# Patient Record
Sex: Female | Born: 1955 | Race: White | Hispanic: No | Marital: Married | Smoking: Former smoker
Health system: Southern US, Community
[De-identification: ages and names within clinical notes are randomized; demographics above are authoritative.]

## PROBLEM LIST (undated history)

## (undated) DIAGNOSIS — Z1371 Encounter for nonprocreative screening for genetic disease carrier status: Secondary | ICD-10-CM

## (undated) DIAGNOSIS — M810 Age-related osteoporosis without current pathological fracture: Secondary | ICD-10-CM

## (undated) DIAGNOSIS — D649 Anemia, unspecified: Secondary | ICD-10-CM

## (undated) DIAGNOSIS — K635 Polyp of colon: Secondary | ICD-10-CM

## (undated) DIAGNOSIS — T4145XA Adverse effect of unspecified anesthetic, initial encounter: Secondary | ICD-10-CM

## (undated) DIAGNOSIS — T8859XA Other complications of anesthesia, initial encounter: Secondary | ICD-10-CM

## (undated) HISTORY — PX: CHOLECYSTECTOMY: SHX55

## (undated) HISTORY — PX: APPENDECTOMY: SHX54

## (undated) HISTORY — PX: LAPAROSCOPIC OOPHERECTOMY: SHX6507

## (undated) HISTORY — PX: ABDOMINAL HYSTERECTOMY: SHX81

## (undated) HISTORY — DX: Encounter for nonprocreative screening for genetic disease carrier status: Z13.71

## (undated) HISTORY — PX: DIAGNOSTIC LAPAROSCOPY: SUR761

---

## 1999-01-13 ENCOUNTER — Other Ambulatory Visit: Admission: RE | Admit: 1999-01-13 | Discharge: 1999-01-13 | Payer: Self-pay | Admitting: Gynecology

## 2001-08-16 ENCOUNTER — Ambulatory Visit (HOSPITAL_COMMUNITY): Admission: RE | Admit: 2001-08-16 | Discharge: 2001-08-16 | Payer: Self-pay | Admitting: Gynecology

## 2001-08-16 ENCOUNTER — Encounter: Payer: Self-pay | Admitting: Gynecology

## 2002-01-26 ENCOUNTER — Ambulatory Visit (HOSPITAL_COMMUNITY): Admission: RE | Admit: 2002-01-26 | Discharge: 2002-01-26 | Payer: Self-pay | Admitting: Gynecology

## 2002-10-12 ENCOUNTER — Other Ambulatory Visit: Admission: RE | Admit: 2002-10-12 | Discharge: 2002-10-12 | Payer: Self-pay | Admitting: Gynecology

## 2004-03-17 ENCOUNTER — Other Ambulatory Visit: Admission: RE | Admit: 2004-03-17 | Discharge: 2004-03-17 | Payer: Self-pay | Admitting: Gynecology

## 2004-12-09 ENCOUNTER — Ambulatory Visit: Payer: Self-pay | Admitting: Neurosurgery

## 2005-04-20 ENCOUNTER — Other Ambulatory Visit: Admission: RE | Admit: 2005-04-20 | Discharge: 2005-04-20 | Payer: Self-pay | Admitting: Gynecology

## 2005-04-23 ENCOUNTER — Observation Stay (HOSPITAL_COMMUNITY): Admission: RE | Admit: 2005-04-23 | Discharge: 2005-04-24 | Payer: Self-pay | Admitting: Gynecology

## 2005-04-23 ENCOUNTER — Ambulatory Visit (HOSPITAL_BASED_OUTPATIENT_CLINIC_OR_DEPARTMENT_OTHER): Admission: RE | Admit: 2005-04-23 | Discharge: 2005-04-23 | Payer: Self-pay | Admitting: Gynecology

## 2005-04-23 ENCOUNTER — Ambulatory Visit (HOSPITAL_COMMUNITY): Admission: RE | Admit: 2005-04-23 | Discharge: 2005-04-23 | Payer: Self-pay | Admitting: Gynecology

## 2006-04-28 ENCOUNTER — Other Ambulatory Visit: Admission: RE | Admit: 2006-04-28 | Discharge: 2006-04-28 | Payer: Self-pay | Admitting: Gynecology

## 2008-10-16 ENCOUNTER — Ambulatory Visit: Payer: Self-pay | Admitting: Otolaryngology

## 2013-10-31 ENCOUNTER — Ambulatory Visit: Payer: Self-pay | Admitting: Podiatry

## 2013-12-18 ENCOUNTER — Other Ambulatory Visit: Payer: Self-pay

## 2013-12-18 DIAGNOSIS — Z1231 Encounter for screening mammogram for malignant neoplasm of breast: Secondary | ICD-10-CM

## 2014-01-07 ENCOUNTER — Ambulatory Visit
Admission: RE | Admit: 2014-01-07 | Discharge: 2014-01-07 | Disposition: A | Discharge status: Home / Self Care | Payer: 59 | Source: Ambulatory Visit

## 2014-01-07 DIAGNOSIS — Z1231 Encounter for screening mammogram for malignant neoplasm of breast: Secondary | ICD-10-CM

## 2014-01-08 ENCOUNTER — Ambulatory Visit: Payer: Self-pay

## 2015-02-13 ENCOUNTER — Encounter: Payer: Self-pay | Admitting: Podiatry

## 2015-02-13 ENCOUNTER — Ambulatory Visit (INDEPENDENT_AMBULATORY_CARE_PROVIDER_SITE_OTHER): Payer: 59 | Admitting: Podiatry

## 2015-02-13 VITALS — BP 179/98 | HR 78 | Resp 16 | Ht 60.0 in | Wt 170.0 lb

## 2015-02-13 DIAGNOSIS — L603 Nail dystrophy: Secondary | ICD-10-CM

## 2015-02-13 DIAGNOSIS — L608 Other nail disorders: Secondary | ICD-10-CM

## 2015-02-13 DIAGNOSIS — B351 Tinea unguium: Secondary | ICD-10-CM

## 2015-02-13 NOTE — Progress Notes (Signed)
   Subjective:    Patient ID: Diana Rodriguez, female    DOB: May 15, 1956, 59 y.o.   MRN: [id number]  HPI  59 year old female presents the office today with complaints of bilateral big toe nail discoloration and separation of the nail from the underlying skin. She states that this been ongoing for approximate 6 months. She believes that this area started after getting a pedicure that she is unsure. She denies any drainage or redness on the nails. She does state of the right inside border the big toenail does hurt occasionally however it is currently asymptomatic. No other complaints at this time.  Review of Systems  All other systems reviewed and are negative.      Objective:   Physical Exam AAO x3, NAD DP/PT pulses palpable bilaterally, CRT less than 3 seconds Protective sensation intact with Simms Weinstein monofilament, vibratory sensation intact, Achilles tendon reflex intact Bilateral hallux nails are loose from the underlying nailbed distally however it is firmly adhered proximally. There is no sensory thickened discoloration to the left hallux nail. There is some discoloration to the right hallux nail particularly in the medial nail border and which it is slightly more thickened and has some dark discoloration. There is mild incurvation of the nail. There is mild tenderness upon the distal medial aspect of the nail border have there is no pain on the proximal medial nail border of the right hallux. There is no surrounding erythema bilaterally or any ascending cellulitis, drainage/purulence, malodor, or other clinical signs of infection. The remaining nails appear to be without pathology. No other areas of tenderness to bilateral lower extremities. MMT 5/5, ROM WNL.  No open lesions or pre-ulcerative lesions.  No overlying edema, erythema, increase in warmth to bilateral lower extremities.  No pain with calf compression, swelling, warmth, erythema bilaterally.      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 -Treatment options were discussed the patient include alternatives, risks, complications. -Distal medial portion of the right hallux nail sharply debrided without complications to patient comfort. Discussed the area remains symptomatically in the future may need a partial nail avulsion with chemical matricectomy. -At this time the nails were biopsied and sent to [facility] for evaluation. I discussed possible treatments for onychomycosis however will await culture results before initiating treatment. If the culture results are negative discussed N[place]ail.  -Follow-up after culture results are obtained or sooner if any palms are to arise. In the meantime occurs call the office questions, concerns, changes symptoms.

## 2015-02-14 ENCOUNTER — Encounter: Payer: Self-pay | Admitting: Podiatry

## 2015-02-27 ENCOUNTER — Encounter: Payer: Self-pay | Admitting: Podiatry

## 2015-03-11 ENCOUNTER — Other Ambulatory Visit: Payer: Self-pay

## 2015-03-11 ENCOUNTER — Ambulatory Visit (INDEPENDENT_AMBULATORY_CARE_PROVIDER_SITE_OTHER): Payer: 59 | Admitting: Podiatry

## 2015-03-11 VITALS — BP 118/78 | HR 61 | Resp 16

## 2015-03-11 DIAGNOSIS — B351 Tinea unguium: Secondary | ICD-10-CM | POA: Diagnosis not present

## 2015-03-11 DIAGNOSIS — Z1231 Encounter for screening mammogram for malignant neoplasm of breast: Secondary | ICD-10-CM

## 2015-03-11 MED ORDER — TAVABOROLE 5 % EX SOLN: 1.0000 [drp] | CUTANEOUS | Status: DC

## 2015-03-13 NOTE — Progress Notes (Signed)
Patient ID: Diana Rodriguez, female   DOB: 1956-04-22, 59 y.o.   MRN: [id number]  Subjective: 59 year old female presents the office today to discuss nail biopsy results. She denies any acute changes since last appointment there is no new complaints at this time. She denies any redness or drainage on the nail sites and denies any pain with him at this time.  Objective: AAO 3, NAD Neurovascular status unchanged. Bilateral hallux nails are firmly adhered to the underlying nail bed. There is continuation of his coloration, dystrophy, hypertrophy of the left hallux toenail. There is continuing discoloration the right hallux nail and mild hypertrophy/discoloration the nail particularly within the nail border. There is no tenderness palpation along the toenails there is no surrounding erythema or drainage. No areas of tenderness to bilateral lower extremity's. No open edema, erythema, increase in warmth. No open lesions or pre-ulcerative lesions. No pain with calf compression, swelling, warmth, erythema.  Assessment: 59 year old female with onychomycosis  Plan: -Nail biopsy results were discussed the patient which revealed onychomycosis (Saprophytic fungi). I discussed treatment options for this particular fungus. At this time she has elected to proceed with kerydin, knowing that it may not be the best option for this fungus. Perception was sent to [facility]. Discussed how to apply the medication as well as side effects. Follow-up as needed. In the meantime encouraged to call the office with any questions or concerns.

## 2015-03-24 ENCOUNTER — Ambulatory Visit
Admission: RE | Admit: 2015-03-24 | Discharge: 2015-03-24 | Disposition: A | Discharge status: Home / Self Care | Payer: 59 | Source: Ambulatory Visit

## 2015-03-24 DIAGNOSIS — Z1231 Encounter for screening mammogram for malignant neoplasm of breast: Secondary | ICD-10-CM

## 2015-06-30 ENCOUNTER — Encounter: Admission: RE | Payer: Self-pay | Source: Ambulatory Visit

## 2015-06-30 ENCOUNTER — Ambulatory Visit: Admission: RE | Admit: 2015-06-30 | Payer: 59 | Source: Ambulatory Visit | Admitting: Gastroenterology

## 2015-06-30 SURGERY — COLONOSCOPY WITH PROPOFOL
Anesthesia: General

## 2015-09-10 ENCOUNTER — Telehealth: Payer: Self-pay | Admitting: *Deleted

## 2015-09-10 MED ORDER — TAVABOROLE 5 % EX SOLN: 1.0000 [drp] | CUTANEOUS | Status: DC

## 2015-09-10 NOTE — Telephone Encounter (Signed)
Refill request for Kerydin.  Dr. Berton Lan refills +[phone].

## 2016-01-28 ENCOUNTER — Other Ambulatory Visit: Payer: Self-pay

## 2016-01-28 DIAGNOSIS — Z1231 Encounter for screening mammogram for malignant neoplasm of breast: Secondary | ICD-10-CM

## 2016-02-24 DIAGNOSIS — B9689 Other specified bacterial agents as the cause of diseases classified elsewhere: Secondary | ICD-10-CM | POA: Diagnosis not present

## 2016-02-24 DIAGNOSIS — J208 Acute bronchitis due to other specified organisms: Secondary | ICD-10-CM | POA: Diagnosis not present

## 2016-02-24 MED FILL — predniSONE 5 MG (21) TBPK: 5 | 6 days supply | Qty: 21 | Fill #0

## 2016-02-24 MED FILL — MOXIFLOXACIN HCL 400 MG TAB: 400 | 10 days supply | Qty: 10 | Fill #0

## 2016-03-22 DIAGNOSIS — Z1283 Encounter for screening for malignant neoplasm of skin: Secondary | ICD-10-CM | POA: Diagnosis not present

## 2016-03-22 DIAGNOSIS — L820 Inflamed seborrheic keratosis: Secondary | ICD-10-CM | POA: Diagnosis not present

## 2016-03-22 DIAGNOSIS — D485 Neoplasm of uncertain behavior of skin: Secondary | ICD-10-CM | POA: Diagnosis not present

## 2016-03-24 ENCOUNTER — Ambulatory Visit
Admission: RE | Admit: 2016-03-24 | Discharge: 2016-03-24 | Disposition: A | Discharge status: Home / Self Care | Payer: 59 | Source: Ambulatory Visit

## 2016-03-24 DIAGNOSIS — Z1231 Encounter for screening mammogram for malignant neoplasm of breast: Secondary | ICD-10-CM | POA: Diagnosis not present

## 2016-04-06 DIAGNOSIS — Z0000 Encounter for general adult medical examination without abnormal findings: Secondary | ICD-10-CM | POA: Diagnosis not present

## 2016-04-14 DIAGNOSIS — R7301 Impaired fasting glucose: Secondary | ICD-10-CM | POA: Diagnosis not present

## 2016-04-14 DIAGNOSIS — M810 Age-related osteoporosis without current pathological fracture: Secondary | ICD-10-CM | POA: Diagnosis not present

## 2016-04-14 DIAGNOSIS — M25474 Effusion, right foot: Secondary | ICD-10-CM | POA: Diagnosis not present

## 2016-04-14 DIAGNOSIS — Z0000 Encounter for general adult medical examination without abnormal findings: Secondary | ICD-10-CM | POA: Diagnosis not present

## 2016-05-05 DIAGNOSIS — Z1211 Encounter for screening for malignant neoplasm of colon: Secondary | ICD-10-CM | POA: Diagnosis not present

## 2016-05-05 DIAGNOSIS — M810 Age-related osteoporosis without current pathological fracture: Secondary | ICD-10-CM | POA: Diagnosis not present

## 2016-08-20 ENCOUNTER — Encounter: Payer: Self-pay | Admitting: *Deleted

## 2016-08-23 ENCOUNTER — Ambulatory Visit: Payer: 59 | Admitting: Anesthesiology

## 2016-08-23 ENCOUNTER — Ambulatory Visit
Admission: RE | Admit: 2016-08-23 | Discharge: 2016-08-23 | Disposition: A | Discharge status: Home / Self Care | Payer: 59 | Source: Ambulatory Visit | Attending: Gastroenterology | Admitting: Gastroenterology

## 2016-08-23 ENCOUNTER — Encounter
Admission: RE | Disposition: A | Discharge status: Home / Self Care | Payer: Self-pay | Source: Ambulatory Visit | Attending: Gastroenterology

## 2016-08-23 DIAGNOSIS — Q439 Congenital malformation of intestine, unspecified: Secondary | ICD-10-CM | POA: Diagnosis not present

## 2016-08-23 DIAGNOSIS — D122 Benign neoplasm of ascending colon: Secondary | ICD-10-CM | POA: Diagnosis not present

## 2016-08-23 DIAGNOSIS — Z1211 Encounter for screening for malignant neoplasm of colon: Secondary | ICD-10-CM | POA: Insufficient documentation

## 2016-08-23 DIAGNOSIS — M810 Age-related osteoporosis without current pathological fracture: Secondary | ICD-10-CM | POA: Insufficient documentation

## 2016-08-23 DIAGNOSIS — Z881 Allergy status to other antibiotic agents status: Secondary | ICD-10-CM | POA: Diagnosis not present

## 2016-08-23 DIAGNOSIS — D123 Benign neoplasm of transverse colon: Secondary | ICD-10-CM | POA: Diagnosis not present

## 2016-08-23 DIAGNOSIS — Z880 Allergy status to penicillin: Secondary | ICD-10-CM | POA: Diagnosis not present

## 2016-08-23 DIAGNOSIS — K6389 Other specified diseases of intestine: Secondary | ICD-10-CM | POA: Diagnosis not present

## 2016-08-23 DIAGNOSIS — D120 Benign neoplasm of cecum: Secondary | ICD-10-CM | POA: Insufficient documentation

## 2016-08-23 DIAGNOSIS — K635 Polyp of colon: Secondary | ICD-10-CM | POA: Diagnosis not present

## 2016-08-23 DIAGNOSIS — Z886 Allergy status to analgesic agent status: Secondary | ICD-10-CM | POA: Insufficient documentation

## 2016-08-23 DIAGNOSIS — D649 Anemia, unspecified: Secondary | ICD-10-CM | POA: Insufficient documentation

## 2016-08-23 DIAGNOSIS — Z885 Allergy status to narcotic agent status: Secondary | ICD-10-CM | POA: Insufficient documentation

## 2016-08-23 HISTORY — PX: COLONOSCOPY WITH PROPOFOL: SHX5780

## 2016-08-23 HISTORY — DX: Age-related osteoporosis without current pathological fracture: M81.0

## 2016-08-23 HISTORY — DX: Anemia, unspecified: D64.9

## 2016-08-23 SURGERY — COLONOSCOPY WITH PROPOFOL
Anesthesia: General

## 2016-08-23 MED ORDER — PROPOFOL 500 MG/50ML IV EMUL
INTRAVENOUS | Status: DC | PRN
  Administered 2016-08-23: 150 ug/kg/min via INTRAVENOUS

## 2016-08-23 MED ORDER — SODIUM CHLORIDE 0.9 % IV SOLN: INTRAVENOUS | Status: DC

## 2016-08-23 MED ORDER — FENTANYL CITRATE (PF) 100 MCG/2ML IJ SOLN
INTRAMUSCULAR | Status: DC | PRN
  Administered 2016-08-23: 50 ug via INTRAVENOUS

## 2016-08-23 MED ORDER — SPOT INK MARKER SYRINGE KIT
PACK | SUBMUCOSAL | Status: DC | PRN
  Administered 2016-08-23: 3 mL via SUBMUCOSAL

## 2016-08-23 MED ORDER — MIDAZOLAM HCL 2 MG/2ML IJ SOLN
INTRAMUSCULAR | Status: DC | PRN
  Administered 2016-08-23: 1 mg via INTRAVENOUS

## 2016-08-23 MED ORDER — PROPOFOL 10 MG/ML IV BOLUS
INTRAVENOUS | Status: DC | PRN
  Administered 2016-08-23: 50 mg via INTRAVENOUS

## 2016-08-23 MED ORDER — SODIUM CHLORIDE 0.9 % IV SOLN
INTRAVENOUS | Status: DC
  Administered 2016-08-23: 11:00:00 via INTRAVENOUS
  Administered 2016-08-23: 1000 mL via INTRAVENOUS

## 2016-08-23 NOTE — Anesthesia Postprocedure Evaluation (Signed)
Anesthesia Post Note  Patient: Diana Rodriguez  Procedure(s) Performed: Procedure(s) (LRB): COLONOSCOPY WITH PROPOFOL (N/A)  Patient location during evaluation: Endoscopy Anesthesia Type: General Level of consciousness: awake and alert and oriented Pain management: pain level controlled Vital Signs Assessment: post-procedure vital signs reviewed and stable Respiratory status: spontaneous breathing, nonlabored ventilation and respiratory function stable Cardiovascular status: blood pressure returned to baseline and stable Postop Assessment: no signs of nausea or vomiting Anesthetic complications: no    Last Vitals:  Vitals:   08/23/16 1150 08/23/16 1200  BP: 123/70 125/71  Pulse: (!) 51 62  Resp: 11 13  Temp:      Last Pain:  Vitals:   08/23/16 1130  TempSrc: Tympanic                 Kais Monje

## 2016-08-23 NOTE — Transfer of Care (Signed)
Immediate Anesthesia Transfer of Care Note  Patient: DERRY SLAVEN  Procedure(s) Performed: Procedure(s): COLONOSCOPY WITH PROPOFOL (N/A)  Patient Location: PACU  Anesthesia Type:General  Level of Consciousness: sedated  Airway & Oxygen Therapy: Patient Spontanous Breathing and Patient connected to nasal cannula oxygen  Post-op Assessment: Report given to RN and Post -op Vital signs reviewed and stable  Post vital signs: Reviewed and stable  Last Vitals:  Vitals:   08/23/16 0850 08/23/16 1130  BP: 140/61 (!) 109/55  Pulse: (!) 56 62  Resp: 16 16  Temp: (!) 35.6 C (!) 35.9 C    Last Pain:  Vitals:   08/23/16 1130  TempSrc: Tympanic         Complications: No apparent anesthesia complications

## 2016-08-23 NOTE — Anesthesia Preprocedure Evaluation (Signed)
Anesthesia Evaluation  Patient identified by MRN, date of birth, ID band Patient awake    Reviewed: Allergy & Precautions, NPO status , Patient's Chart, lab work & pertinent test results  History of Anesthesia Complications Negative for: history of anesthetic complications  Airway Mallampati: II  TM Distance: >3 FB Neck ROM: Full    Dental no notable dental hx.    Pulmonary neg sleep apnea, neg COPD, former smoker,    breath sounds clear to auscultation- rhonchi (-) wheezing      Cardiovascular Exercise Tolerance: Good (-) hypertension(-) CAD and (-) Past MI  Rhythm:Regular Rate:Normal - Systolic murmurs and - Diastolic murmurs    Neuro/Psych negative neurological ROS  negative psych ROS   GI/Hepatic Neg liver ROS,   Endo/Other  negative endocrine ROS  Renal/GU negative Renal ROS     Musculoskeletal   Abdominal (+) + obese,   Peds  Hematology negative hematology ROS (+)   Anesthesia Other Findings Past Medical History: No date: Anemia No date: Osteoporosis   Reproductive/Obstetrics                            Anesthesia Physical Anesthesia Plan  ASA: II  Anesthesia Plan: General   Post-op Pain Management:    Induction: Intravenous  Airway Management Planned: Natural Airway  Additional Equipment:   Intra-op Plan:   Post-operative Plan:   Informed Consent: I have reviewed the patients History and Physical, chart, labs and discussed the procedure including the risks, benefits and alternatives for the proposed anesthesia with the patient or authorized representative who has indicated his/her understanding and acceptance.   Dental advisory given  Plan Discussed with: CRNA and Anesthesiologist  Anesthesia Plan Comments:         Anesthesia Quick Evaluation

## 2016-08-23 NOTE — H&P (Signed)
Outpatient short stay form Pre-procedure 08/23/2016 10:37 AM Lollie Sails MD  Primary Physician: Dr. Maryland Pink  Reason for visit: Colonoscopy  History of present illness:  Patient is a 60 year old female presenting today for screening colonoscopy. This is her first colonoscopy. She takes no aspirin or blood thinning agents. Her prep well.     Current Facility-Administered Medications:  .  0.9 %  sodium chloride infusion, , Intravenous, Continuous, Lollie Sails, MD, Last Rate: 20 mL/hr at 08/23/16 0902, 1,000 mL at 08/23/16 0902 .  0.9 %  sodium chloride infusion, , Intravenous, Continuous, Lollie Sails, MD  Prescriptions Prior to Admission  Medication Sig Dispense Refill Last Dose  . Tavaborole (KERYDIN) 5 % SOLN Apply 1 drop topically 1 day or 1 dose. Apply 1 drop to the toenail daily. (Patient not taking: Reported on 08/23/2016) 10 mL 11 Completed Course at Unknown time     Allergies  Allergen Reactions  . Erythromycin Anaphylaxis  . Advil [Ibuprofen] Swelling  . Cephalexin Hives  . Doxycycline Hives  . Penicillins Hives  . Septra [Sulfamethoxazole-Trimethoprim] Hives  . Vicodin [Hydrocodone-Acetaminophen] Hives     Past Medical History:  Diagnosis Date  . Anemia   . Osteoporosis     Review of systems:      Physical Exam    Heart and lungs: Regular rate and rhythm without rub or gallop, lungs are bilaterally clear.    HEENT: Normocephalic atraumatic eyes are anicteric    Other:     Pertinant exam for procedure: Soft nontender nondistended bowel sounds positive normoactive.    Planned proceedures: Colonoscopy and indicated procedures. I have discussed the risks benefits and complications of procedures to include not limited to bleeding, infection, perforation and the risk of sedation and the patient wishes to proceed.    Lollie Sails, MD Gastroenterology 08/23/2016  10:37 AM

## 2016-08-23 NOTE — Op Note (Signed)
[facility] Gastroenterology Patient Name: Diana Rodriguez Procedure Date: 08/23/2016 10:39 AM MRN: [id number] Account #: [id number] Date of Birth: 06-Dec-1955 Admit Type: Outpatient Age: 60 Room: [facility] ENDO ROOM 3 Gender: Female Note Status: Finalized Procedure:            Colonoscopy Indications:          Screening for colorectal malignant neoplasm, This is                        the patient's first colonoscopy Providers:            Lollie Sails, MD Referring MD:         Irven Easterly. Kary Kos, MD (Referring MD) Medicines:            Monitored Anesthesia Care Complications:        No immediate complications. Procedure:            Pre-Anesthesia Assessment:                       - ASA Grade Assessment: II - A patient with mild                        systemic disease.                       After obtaining informed consent, the colonoscope was                        passed under direct vision. Throughout the procedure,                        the patient's blood pressure, pulse, and oxygen                        saturations were monitored continuously. The                        Colonoscope was introduced through the anus and                        advanced to the the cecum, identified by appendiceal                        orifice and ileocecal valve. The colonoscopy was                        performed with moderate difficulty due to a tortuous                        colon. Successful completion of the procedure was aided                        by using manual pressure. The patient tolerated the                        procedure well. The quality of the bowel preparation                        was good. Findings:      Two flat polyps were found in the transverse colon. The polyps were 2  to       3 mm in size. These polyps were removed with a cold biopsy forceps.       Resection and retrieval were complete.      A 2 mm polyp was found in the cecum. The polyp was  sessile. The polyp       was removed with a cold biopsy forceps. Resection and retrieval were       complete.      A greater than 50 mm polyp was found in the proximal ascending colon.       The polyp was carpet-like and sessile. Biopsies were taken with a cold       forceps for histology.      The exam was otherwise without abnormality.      The digital rectal exam was normal. Impression:           - Two 2 to 3 mm polyps in the transverse colon, removed                        with a cold biopsy forceps. Resected and retrieved.                       - One 2 mm polyp in the cecum, removed with a cold                        biopsy forceps. Resected and retrieved.                       - One greater than 50 mm polyp in the proximal                        ascending colon. Biopsied.                       - The examination was otherwise normal. Recommendation:       - Discharge patient to home.                       - Await pathology results. Procedure Code(s):    --- Professional ---                       [phone], Colonoscopy, flexible; with biopsy, single or                        multiple Diagnosis Code(s):    --- Professional ---                       Z12.11, Encounter for screening for malignant neoplasm                        of colon                       D12.3, Benign neoplasm of transverse colon (hepatic                        flexure or splenic flexure)                       D12.0, Benign neoplasm of cecum  D12.2, Benign neoplasm of ascending colon CPT copyright 2016 American Medical Association. All rights reserved. The codes documented in this report are preliminary and upon coder review may  be revised to meet current compliance requirements. Lollie Sails, MD 08/23/2016 11:33:56 AM This report has been signed electronically. Number of Addenda: 0 Note Initiated On: 08/23/2016 10:39 AM Scope Withdrawal Time: 0 hours 18 minutes 42 seconds  Total Procedure  Duration: 0 hours 38 minutes 32 seconds       [facility]

## 2016-08-24 ENCOUNTER — Encounter: Payer: Self-pay | Admitting: Gastroenterology

## 2016-08-24 ENCOUNTER — Encounter: Payer: Self-pay | Admitting: *Deleted

## 2016-08-24 LAB — SURGICAL PATHOLOGY

## 2016-09-06 ENCOUNTER — Ambulatory Visit (INDEPENDENT_AMBULATORY_CARE_PROVIDER_SITE_OTHER): Payer: 59 | Admitting: General Surgery

## 2016-09-06 ENCOUNTER — Encounter: Payer: Self-pay | Admitting: General Surgery

## 2016-09-06 VITALS — BP 126/72 | HR 76 | Resp 12 | Ht 60.0 in | Wt 172.0 lb

## 2016-09-06 DIAGNOSIS — D122 Benign neoplasm of ascending colon: Secondary | ICD-10-CM | POA: Diagnosis not present

## 2016-09-06 NOTE — Progress Notes (Signed)
Patient ID: Diana Rodriguez, female   DOB: 1956/09/26, 60 y.o.   MRN: [id number]  Chief Complaint  Patient presents with  . Other    discuss colon polyp    HPI Diana Rodriguez is a 60 y.o. female here today to discuss a colon polyp removal. She had a colonoscopy done on 08/23/16.Denies GI Issues, moves bowels daily. No family history of colon cancer.    I have reviewed the history of present illness with the patient.  HPI  Past Medical History:  Diagnosis Date  . Anemia   . Osteoporosis     Past Surgical History:  Procedure Laterality Date  . ABDOMINAL HYSTERECTOMY    . APPENDECTOMY    . CHOLECYSTECTOMY    . COLONOSCOPY WITH PROPOFOL N/A 08/23/2016   Procedure: COLONOSCOPY WITH PROPOFOL;  Surgeon: Lollie Sails, MD;  Location: [facility] ENDOSCOPY;  Service: Endoscopy;  Laterality: N/A;  . DIAGNOSTIC LAPAROSCOPY    . LAPAROSCOPIC OOPHERECTOMY      Family History  Problem Relation Age of Onset  . Cancer Mother     lung  . Cancer Father     lung  . Cancer Paternal Aunt     breast cancer    Social History Social History  Substance Use Topics  . Smoking status: Former Smoker    Quit date: 11/29/1990  . Smokeless tobacco: Former Systems developer  . Alcohol use 0.0 oz/week    Allergies  Allergen Reactions  . Erythromycin Anaphylaxis  . Advil [Ibuprofen] Swelling  . Cephalexin Hives  . Doxycycline Hives  . Penicillins Hives  . Septra [Sulfamethoxazole-Trimethoprim] Hives  . Vicodin [Hydrocodone-Acetaminophen] Hives    No current outpatient prescriptions on file.   No current facility-administered medications for this visit.     Review of Systems Review of Systems  Constitutional: Negative.   Respiratory: Negative.   Cardiovascular: Negative.   Gastrointestinal: Negative.     Blood pressure 126/72, pulse 76, resp. rate 12, height 5' (1.524 m), weight 172 lb (78 kg).  Physical Exam Physical Exam  Constitutional: She is oriented to person, place, and time. She appears  well-developed and well-nourished.  Eyes: Conjunctivae are normal. No scleral icterus.  Neck: Neck supple.  Cardiovascular: Normal rate, regular rhythm and normal heart sounds.   Pulmonary/Chest: Effort normal and breath sounds normal.  Abdominal: Soft. Bowel sounds are normal. There is no tenderness. A hernia (small umbilical hernia present) is present.  Lymphadenopathy:    She has no cervical adenopathy.  Neurological: She is alert and oriented to person, place, and time.  Skin: Skin is warm and dry.    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 Rationale , risks and benefits explained Schedule  Laparoscopy with  resection of  right colon. Risks and benefits of the procedure were discussed with the patient.   Approximate 6 weeks for return to full activity. May return to her current work in 2-3 weeks   This information has been scribed by Verlene Mayer, [place] 09/06/2016, 4:10 PM

## 2016-09-06 NOTE — Patient Instructions (Addendum)
Laparoscopic Colectomy  Laparoscopic colectomy is surgery to remove part or all of the large intestine (colon). This procedure is used to treat several conditions, including:  · Inflammation and infection of the colon (diverticulitis).  · Tumors or masses in the colon.  · Inflammatory bowel disease, such as Crohn disease or ulcerative colitis. Colectomy is an option when symptoms cannot be controlled with medicines.  · Bleeding from the colon that cannot be controlled by another method.  · Blockage or obstruction of the colon.  LET YOUR HEALTH CARE PROVIDER KNOW ABOUT:  · Any allergies you have.  · All medicines you are taking, including vitamins, herbs, eye drops, creams, and over-the-counter medicines.  · Previous problems you or members of your family have had with the use of anesthetics.  · Any blood disorders you have.  · Previous surgeries you have had.  · Medical conditions you have.  RISKS AND COMPLICATIONS  Generally, this is a safe procedure. However, as with any procedure, complications can occur. Possible complications include:  · Infection.  · Bleeding.  · Damage to other organs.  · Leaking from where the colon was sewn together.  · Future blockage of the small intestines from scar tissue. Another surgery may be needed to repair this.  In some cases, complications such as damage to other organs or excessive bleeding may require the surgeon to convert from a laparoscopic procedure to an open procedure. This involves making a larger incision in the abdomen to perform the procedure.  BEFORE THE PROCEDURE  · Ask your health care provider about changing or stopping any regular medicines.  · You may be prescribed an oral bowel prep. This involves drinking a large amount of medicated liquid, starting the day before your surgery. The liquid will cause you to have multiple loose stools until your stool is almost clear or light green. This cleans out your colon in preparation for the surgery.  · Do not eat or  drink anything else once you have started the bowel prep, unless your health care provider tells you it is safe to do so.  · You may also be given antibiotic pills to clean out your colon of bacteria. Be sure to follow the directions carefully and take the medicine at the correct time.  PROCEDURE   · Small monitors will be put on your body. They are used to check your heart, blood pressure, and oxygen level.  · An IV access tube will be put into one of your veins. Medicine will be able to flow directly into your body through this IV tube.  · You might be given a medicine to help you relax (sedative).  · You will be given a medicine to make you sleep through the procedure (general anesthetic). A breathing tube may be placed into your lungs during the procedure.  · A thin, flexible tube (catheter) will be placed into your bladder to collect urine.  · A tube may be put in through your nose. It is called a nasogastric tube. It is used to remove stomach fluids after surgery until the intestines start working again.  · Your abdomen will be filled with air so that it expands. This gives the surgeon more room to operate and makes your organs easier to see.  · Several small cuts (incisions) are made in your abdomen.  · A thin, lighted tube with a tiny camera on the end (laparoscope) is put through one of the small incisions. The camera on the laparoscope   sends a picture to a TV screen in the operating room. This gives the surgeon a good view inside your abdomen.  · Hollow tubes are put through the other small incisions in your abdomen. The tools needed for the procedure are put through these tubes.  · Clamps or staples are put on both ends of the diseased part of the colon.  · The part of the intestine between the clamps or staples is removed.  · If possible, the ends of the healthy colon that remain will be stitched or stapled together to allow your body to expel waste (stool).  · Sometimes, the remaining colon cannot be  stitched back together. If this is the case, a colostomy is needed. For a colostomy:  ¨ An opening (stoma) to the outside of your body is made through the abdomen.  ¨ The end of the colon is brought to the opening. It is stitched to the skin.  ¨ A bag is attached to the opening. Stool will drain into this bag. The bag is removable.  ¨ The colostomy can be temporary or permanent.  · The incisions from the colectomy are closed with stitches or staples.  AFTER THE PROCEDURE  · You will be monitored closely in a recovery area until you are stable and doing well. You will then be moved to a regular hospital room.  · You will need to receive fluids through an IV tube until your bowel function has returned. This may take 1-3 days. Once your bowels are working again, you will be started on clear liquids and then advanced to solid food as tolerated.  · You will be given pain medicines to control your pain.     This information is not intended to replace advice given to you by your health care provider. Make sure you discuss any questions you have with your health care provider.     Document Released: 02/05/2003 Document Revised: 09/05/2013 Document Reviewed: 06/27/2013  Elsevier Interactive Patient Education ©2016 [employer]

## 2016-10-07 ENCOUNTER — Encounter: Payer: Self-pay | Admitting: *Deleted

## 2016-10-07 ENCOUNTER — Telehealth: Payer: Self-pay | Admitting: *Deleted

## 2016-10-07 NOTE — Telephone Encounter (Signed)
Patient's surgery has been scheduled for 12-24-16 at [facility].

## 2016-11-18 ENCOUNTER — Ambulatory Visit: Payer: 59 | Admitting: General Surgery

## 2016-12-01 ENCOUNTER — Encounter: Payer: Self-pay | Admitting: *Deleted

## 2016-12-01 NOTE — Progress Notes (Signed)
Patient's surgery has been rescheduled from 12-24-16 to 12-20-16 due to a change in Dr. Angie Fava schedule. This patient is agreeable.   Leah in the O.R. Notified of date change.

## 2016-12-13 ENCOUNTER — Encounter: Payer: Self-pay | Admitting: General Surgery

## 2016-12-13 ENCOUNTER — Ambulatory Visit (INDEPENDENT_AMBULATORY_CARE_PROVIDER_SITE_OTHER): Payer: 59 | Admitting: General Surgery

## 2016-12-13 VITALS — BP 130/82 | HR 74 | Resp 14 | Ht 60.0 in | Wt 172.0 lb

## 2016-12-13 DIAGNOSIS — D122 Benign neoplasm of ascending colon: Secondary | ICD-10-CM

## 2016-12-13 MED ORDER — POLYETHYLENE GLYCOL 3350 17 GM/SCOOP PO POWD: ORAL | 0 refills | Status: DC

## 2016-12-13 MED ORDER — METRONIDAZOLE 500 MG PO TABS: ORAL_TABLET | ORAL | 0 refills | Status: AC

## 2016-12-13 MED ORDER — NEOMYCIN SULFATE 500 MG PO TABS: ORAL_TABLET | ORAL | 0 refills | Status: DC

## 2016-12-13 NOTE — Progress Notes (Signed)
Patient ID: Diana Rodriguez, female   DOB: [birth year], 61 y.o.   MRN: [id number]  Chief Complaint  Patient presents with  . Pre-op Exam    HPI Diana Rodriguez is a 61 y.o. female here for a pre op visit for colectomy surgery scheduled for 12/20/16.  She had colonoscopy 3 mos ago and multiple polyps were removed,,except on very large one in ascending colon. Pt denies any abdominal or GI symptoms.. I have reviewed the history of present illness with the patient.  HPI  Past Medical History:  Diagnosis Date  . Anemia   . Osteoporosis     Past Surgical History:  Procedure Laterality Date  . ABDOMINAL HYSTERECTOMY    . APPENDECTOMY    . CHOLECYSTECTOMY    . COLONOSCOPY WITH PROPOFOL N/A 08/23/2016   Procedure: COLONOSCOPY WITH PROPOFOL;  Surgeon: Lollie Sails, MD;  Location: [facility] ENDOSCOPY;  Service: Endoscopy;  Laterality: N/A;  . DIAGNOSTIC LAPAROSCOPY    . LAPAROSCOPIC OOPHERECTOMY      Family History  Problem Relation Age of Onset  . Cancer Mother     lung  . Cancer Father     lung  . Cancer Paternal Aunt     breast cancer    Social History Social History  Substance Use Topics  . Smoking status: Former Smoker    Quit date: 11/29/1990  . Smokeless tobacco: Former Systems developer  . Alcohol use 0.0 oz/week    Allergies  Allergen Reactions  . Erythromycin Anaphylaxis  . Advil [Ibuprofen] Swelling  . Cephalexin Hives  . Doxycycline Hives  . Penicillins Hives    Has patient had a PCN reaction causing immediate rash, facial/tongue/throat swelling, SOB or lightheadedness with hypotension:unsure Has patient had a PCN reaction causing severe rash involving mucus membranes or skin necrosis:Yes Has patient had a PCN reaction that required hospitalization:No Has patient had a PCN reaction occurring within the last 10 years:No If all of the above answers are "NO", then may proceed with Cephalosporin use.   . Septra [Sulfamethoxazole-Trimethoprim] Hives  . Vicodin  [Hydrocodone-Acetaminophen] Hives    No current outpatient prescriptions on file.   No current facility-administered medications for this visit.     Review of Systems Review of Systems  Constitutional: Negative.   Respiratory: Negative.   Cardiovascular: Negative.     Blood pressure 130/82, pulse 74, resp. rate 14, height 5' (1.524 m), weight 172 lb (78 kg).  Physical Exam Physical Exam  Constitutional: She is oriented to person, place, and time. She appears well-developed and well-nourished.  Eyes: Conjunctivae are normal. No scleral icterus.  Neck: Neck supple.  Cardiovascular: Normal rate, regular rhythm and normal heart sounds.   Pulmonary/Chest: Effort normal and breath sounds normal.  Abdominal: Soft. Normal appearance and bowel sounds are normal. There is no hepatomegaly. There is no tenderness. No hernia.  Lymphadenopathy:    She has no cervical adenopathy.  Neurological: She is alert and oriented to person, place, and time.  Skin: Skin is warm and dry.  Psychiatric: She has a normal mood and affect.    Data Reviewed Prior note, colonoscopy  Assessment    Large polyp ascending colon.    Plan    As discussed before, will proceed with laparoscopy and right colectomy. This was explained in full to patient again.     We will proceed with patient's surgery as scheduled for 12-20-16 at [facility].   Patient has been asked to complete a bowel prep with antibiotics. Instructions were reviewed today.  This has been scribed by Lesly Rubenstein LPN    Christene Lye 12/13/2016, 4:48 PM

## 2016-12-14 ENCOUNTER — Telehealth: Payer: Self-pay | Admitting: *Deleted

## 2016-12-14 LAB — BASIC METABOLIC PANEL
BUN/Creatinine Ratio: 19 (ref 12–28)
BUN: 14 mg/dL (ref 8–27)
CALCIUM: 8.8 mg/dL (ref 8.7–10.3)
CO2: 25 mmol/L (ref 18–29)
Chloride: 102 mmol/L (ref 96–106)
Creatinine, Ser: 0.74 mg/dL (ref 0.57–1.00)
GFR, EST AFRICAN AMERICAN: 102 mL/min/{1.73_m2} (ref >59)
GFR, EST NON AFRICAN AMERICAN: 88 mL/min/{1.73_m2} (ref >59)
Glucose: 81 mg/dL (ref 65–99)
Potassium: 4.4 mmol/L (ref 3.5–5.2)
SODIUM: 141 mmol/L (ref 134–144)

## 2016-12-14 LAB — CBC WITH DIFFERENTIAL/PLATELET
BASOS: 0 %
Basophils Absolute: 0 10*3/uL (ref 0.0–0.2)
EOS (ABSOLUTE): 0.2 10*3/uL (ref 0.0–0.4)
Eos: 2 %
HEMATOCRIT: 36.3 % (ref 34.0–46.6)
Hemoglobin: 12.3 g/dL (ref 11.1–15.9)
Immature Grans (Abs): 0 10*3/uL (ref 0.0–0.1)
Immature Granulocytes: 0 %
LYMPHS: 33 %
Lymphocytes Absolute: 2.4 10*3/uL (ref 0.7–3.1)
MCH: 30.5 pg (ref 26.6–33.0)
MCHC: 33.9 g/dL (ref 31.5–35.7)
MCV: 90 fL (ref 79–97)
Monocytes Absolute: 0.6 10*3/uL (ref 0.1–0.9)
Monocytes: 8 %
NEUTROS ABS: 4 10*3/uL (ref 1.4–7.0)
Neutrophils: 57 %
Platelets: 268 10*3/uL (ref 150–379)
RBC: 4.03 x10E6/uL (ref 3.77–5.28)
RDW: 13.4 % (ref 12.3–15.4)
WBC: 7.2 10*3/uL (ref 3.4–10.8)

## 2016-12-14 MED FILL — metroNIDAZOLE 500 MG TABS: 500 | 1 days supply | Qty: 2 | Fill #0

## 2016-12-14 MED FILL — POLYETHYLENE GLYCOL 3350: 1 days supply | Qty: 255 | Fill #0

## 2016-12-14 MED FILL — NEOMYCIN 500 MG TABLET: 500 | 1 days supply | Qty: 4 | Fill #0

## 2016-12-14 NOTE — Telephone Encounter (Signed)
Patient called and had a couple of questions regarding some disability paperwork.

## 2016-12-14 NOTE — Telephone Encounter (Signed)
Diana Robinette Haines, MD  Carson Myrtle, RN        Inform pt labs are normal    FMLA  Papers completed.

## 2016-12-15 ENCOUNTER — Other Ambulatory Visit: Payer: Self-pay

## 2016-12-15 NOTE — Patient Outreach (Signed)
[facility]) Care Management  12/15/2016  Diana Rodriguez Jul 15, 1956 [id number]   Subjective-telephone call to patient-discussed [facility] pre-op follow up. Patient voices understanding and agrees to pre-op call. Husband will assist post discharge. No equipment needs.  Objective: per chart review for patient to be admitted on 12/20/16 for right laparoscopic right colectomy. History of osteoporosis, anemia.  Assessment: Received [facility] pre-op call referral on 12/14/16. Pre-op call completed. Diana Rodriguez has completed FMLA forms and has the contact number for [employer]. Short term disability forms also already completed. No equipment needs or home health needs. Diana Rodriguez states she has the [place] and will access this benefit. Patient acknowledges she will have support from her husband post procedure.   Discussed importance of receiving and understanding discharge instructions/after visit summary. Patient reports she has transportation to follow up appointment and she has identified someone to obtain her medications from Pharmacy. Discussed [employer] employee outpatient Pharmacy benefits.   Patient does not have an advanced directive at this time. Opportunities available to complete Advanced directives discussed. Denies any other medical issues. Denies any questions or concerns. No additional community resources needed. RNCM encouraged patient to ask questions as needed.  Patient in agreement to post-op call.  Plan: telephonic RNCM will follow up within 3 business days of notification.  Thea Silversmith, RN, MSN, [place] Coordinator Cell: [phone]

## 2016-12-15 NOTE — Patient Instructions (Signed)
  Your procedure is scheduled on: 12-20-16 [facility]) Report to Same Day Surgery 2nd floor medical mall [facility] Entrance-take elevator on left to 2nd floor.  Check in with surgery information desk.) To find out your arrival time please call [phone] between 1PM - 3PM on 12-17-16 (FRIDAY)  Remember: Instructions that are not followed completely may result in serious medical risk, up to and including death, or upon the discretion of your surgeon and anesthesiologist your surgery may need to be rescheduled.    _x___ 1. Do not eat food or drink liquids after midnight. No gum chewing or hard candies.     __x__ 2. No Alcohol for 24 hours before or after surgery.   __x__3. No Smoking for 24 prior to surgery.   ____  4. Bring all medications with you on the day of surgery if instructed.    __x__ 5. Notify your doctor if there is any change in your medical condition     (cold, fever, infections).     Do not wear jewelry, make-up, hairpins, clips or nail polish.  Do not wear lotions, powders, or perfumes. You may wear deodorant.  Do not shave 48 hours prior to surgery. Men may shave face and neck.  Do not bring valuables to the hospital.    [facility] is not responsible for any belongings or valuables.               Contacts, dentures or bridgework may not be worn into surgery.  Leave your suitcase in the car. After surgery it may be brought to your room.  For patients admitted to the hospital, discharge time is determined by your treatment team.   Patients discharged the day of surgery will not be allowed to drive home.  You will need someone to drive you home and stay with you the night of your procedure.    Please read over the following fact sheets that you were given:   [facility] Preparing for Surgery and or MRSA Information   ____ Take these medicines the morning of surgery with A SIP OF WATER:    1. NONE  2.  3.  4.  5.  6.  ____Fleets enema or Magnesium Citrate as  directed.   _x___ Use CHG Soap or sage wipes as directed on instruction sheet   ____ Use inhalers on the day of surgery and bring to hospital day of surgery  ____ Stop metformin 2 days prior to surgery    ____ Take 1/2 of usual insulin dose the night before surgery and none on the morning of  surgery.   ____ Stop Aspirin, Coumadin, Pllavix ,Eliquis, Effient, or Pradaxa  x__ Stop Anti-inflammatories such as Advil, Aleve, Ibuprofen, Motrin, Naproxen,          Naprosyn, Goodies powders or aspirin products NOW-Ok to take Tylenol.   ____ Stop supplements until after surgery.    ____ Bring C-Pap to the hospital.

## 2016-12-17 ENCOUNTER — Encounter
Admission: RE | Admit: 2016-12-17 | Discharge: 2016-12-17 | Disposition: A | Discharge status: Home / Self Care | Payer: 59 | Source: Ambulatory Visit | Attending: General Surgery | Admitting: General Surgery

## 2016-12-17 DIAGNOSIS — K429 Umbilical hernia without obstruction or gangrene: Secondary | ICD-10-CM | POA: Diagnosis not present

## 2016-12-17 DIAGNOSIS — Z882 Allergy status to sulfonamides status: Secondary | ICD-10-CM | POA: Diagnosis not present

## 2016-12-17 DIAGNOSIS — Z881 Allergy status to other antibiotic agents status: Secondary | ICD-10-CM | POA: Diagnosis not present

## 2016-12-17 DIAGNOSIS — Z885 Allergy status to narcotic agent status: Secondary | ICD-10-CM | POA: Diagnosis not present

## 2016-12-17 DIAGNOSIS — Z880 Allergy status to penicillin: Secondary | ICD-10-CM | POA: Diagnosis not present

## 2016-12-17 DIAGNOSIS — Z886 Allergy status to analgesic agent status: Secondary | ICD-10-CM | POA: Diagnosis not present

## 2016-12-17 DIAGNOSIS — D122 Benign neoplasm of ascending colon: Secondary | ICD-10-CM | POA: Diagnosis not present

## 2016-12-17 DIAGNOSIS — Z87891 Personal history of nicotine dependence: Secondary | ICD-10-CM | POA: Diagnosis not present

## 2016-12-17 DIAGNOSIS — Z01818 Encounter for other preprocedural examination: Secondary | ICD-10-CM

## 2016-12-17 DIAGNOSIS — K660 Peritoneal adhesions (postprocedural) (postinfection): Secondary | ICD-10-CM | POA: Diagnosis not present

## 2016-12-17 HISTORY — DX: Adverse effect of unspecified anesthetic, initial encounter: T41.45XA

## 2016-12-17 HISTORY — DX: Other complications of anesthesia, initial encounter: T88.59XA

## 2016-12-17 LAB — SURGICAL PCR SCREEN
MRSA, PCR: NEGATIVE
STAPHYLOCOCCUS AUREUS: NEGATIVE

## 2016-12-20 ENCOUNTER — Inpatient Hospital Stay
Admission: RE | Admit: 2016-12-20 | Discharge: 2016-12-24 | DRG: 331 | Disposition: A | Discharge status: Home / Self Care | Payer: 59 | Source: Ambulatory Visit | Attending: General Surgery | Admitting: General Surgery

## 2016-12-20 ENCOUNTER — Encounter: Payer: Self-pay | Admitting: *Deleted

## 2016-12-20 ENCOUNTER — Inpatient Hospital Stay: Payer: 59 | Admitting: Anesthesiology

## 2016-12-20 ENCOUNTER — Encounter
Admission: RE | Disposition: A | Discharge status: Home / Self Care | Payer: Self-pay | Source: Ambulatory Visit | Attending: General Surgery

## 2016-12-20 DIAGNOSIS — Z886 Allergy status to analgesic agent status: Secondary | ICD-10-CM

## 2016-12-20 DIAGNOSIS — Z885 Allergy status to narcotic agent status: Secondary | ICD-10-CM

## 2016-12-20 DIAGNOSIS — K429 Umbilical hernia without obstruction or gangrene: Secondary | ICD-10-CM | POA: Diagnosis not present

## 2016-12-20 DIAGNOSIS — Z880 Allergy status to penicillin: Secondary | ICD-10-CM | POA: Diagnosis not present

## 2016-12-20 DIAGNOSIS — D122 Benign neoplasm of ascending colon: Secondary | ICD-10-CM | POA: Diagnosis not present

## 2016-12-20 DIAGNOSIS — K635 Polyp of colon: Secondary | ICD-10-CM | POA: Diagnosis not present

## 2016-12-20 DIAGNOSIS — Z881 Allergy status to other antibiotic agents status: Secondary | ICD-10-CM

## 2016-12-20 DIAGNOSIS — Z79899 Other long term (current) drug therapy: Secondary | ICD-10-CM

## 2016-12-20 DIAGNOSIS — Z87891 Personal history of nicotine dependence: Secondary | ICD-10-CM | POA: Diagnosis not present

## 2016-12-20 DIAGNOSIS — K660 Peritoneal adhesions (postprocedural) (postinfection): Secondary | ICD-10-CM | POA: Diagnosis not present

## 2016-12-20 DIAGNOSIS — Z882 Allergy status to sulfonamides status: Secondary | ICD-10-CM

## 2016-12-20 HISTORY — PX: LAPAROSCOPIC RIGHT COLECTOMY: SHX5925

## 2016-12-20 HISTORY — PX: UMBILICAL HERNIA REPAIR: SHX196

## 2016-12-20 LAB — CBC
HCT: 35.6 % (ref 35.0–47.0)
Hemoglobin: 12.3 g/dL (ref 12.0–16.0)
MCH: 30.9 pg (ref 26.0–34.0)
MCHC: 34.5 g/dL (ref 32.0–36.0)
MCV: 89.6 fL (ref 80.0–100.0)
PLATELETS: 236 10*3/uL (ref 150–440)
RBC: 3.98 MIL/uL (ref 3.80–5.20)
RDW: 12.9 % (ref 11.5–14.5)
WBC: 13.7 10*3/uL — AB (ref 3.6–11.0)

## 2016-12-20 LAB — CREATININE, SERUM: Creatinine, Ser: 0.8 mg/dL (ref 0.44–1.00)

## 2016-12-20 SURGERY — COLECTOMY, RIGHT, LAPAROSCOPIC
Anesthesia: General | Laterality: Right | Wound class: Clean Contaminated

## 2016-12-20 MED ORDER — ROCURONIUM BROMIDE 50 MG/5ML IV SOSY
PREFILLED_SYRINGE | INTRAVENOUS | Status: AC
  Filled 2016-12-20: qty 5

## 2016-12-20 MED ORDER — SODIUM CHLORIDE 0.9 % IJ SOLN
INTRAMUSCULAR | Status: AC
  Filled 2016-12-20: qty 10

## 2016-12-20 MED ORDER — SUGAMMADEX SODIUM 200 MG/2ML IV SOLN
INTRAVENOUS | Status: DC | PRN
  Administered 2016-12-20: 150 mg via INTRAVENOUS

## 2016-12-20 MED ORDER — CHLORHEXIDINE GLUCONATE CLOTH 2 % EX PADS
6.0000 | MEDICATED_PAD | Freq: Once | CUTANEOUS | Status: AC
  Administered 2016-12-20: 6 via TOPICAL

## 2016-12-20 MED ORDER — ROCURONIUM BROMIDE 100 MG/10ML IV SOLN
INTRAVENOUS | Status: DC | PRN
  Administered 2016-12-20 (×4): 10 mg via INTRAVENOUS
  Administered 2016-12-20: 50 mg via INTRAVENOUS
  Administered 2016-12-20: 10 mg via INTRAVENOUS

## 2016-12-20 MED ORDER — MEPERIDINE HCL 25 MG/ML IJ SOLN: 6.2500 mg | INTRAMUSCULAR | Status: DC | PRN

## 2016-12-20 MED ORDER — FENTANYL CITRATE (PF) 100 MCG/2ML IJ SOLN
INTRAMUSCULAR | Status: AC
  Administered 2016-12-20: 25 ug via INTRAVENOUS
  Filled 2016-12-20: qty 2

## 2016-12-20 MED ORDER — OXYCODONE HCL 5 MG PO TABS
5.0000 mg | ORAL_TABLET | ORAL | Status: DC | PRN
  Administered 2016-12-20: 10 mg via ORAL
  Filled 2016-12-20: qty 1
  Filled 2016-12-20: qty 2

## 2016-12-20 MED ORDER — EPHEDRINE 5 MG/ML INJ
INTRAVENOUS | Status: AC
  Filled 2016-12-20: qty 10

## 2016-12-20 MED ORDER — FENTANYL CITRATE (PF) 100 MCG/2ML IJ SOLN
INTRAMUSCULAR | Status: DC | PRN
  Administered 2016-12-20: 50 ug via INTRAVENOUS
  Administered 2016-12-20: 100 ug via INTRAVENOUS
  Administered 2016-12-20: 50 ug via INTRAVENOUS
  Administered 2016-12-20 (×2): 25 ug via INTRAVENOUS

## 2016-12-20 MED ORDER — MIDAZOLAM HCL 2 MG/2ML IJ SOLN
INTRAMUSCULAR | Status: DC | PRN
  Administered 2016-12-20: 2 mg via INTRAVENOUS

## 2016-12-20 MED ORDER — ONDANSETRON HCL 4 MG/2ML IJ SOLN
INTRAMUSCULAR | Status: DC | PRN
  Administered 2016-12-20: 4 mg via INTRAVENOUS

## 2016-12-20 MED ORDER — BUPIVACAINE HCL (PF) 0.5 % IJ SOLN
INTRAMUSCULAR | Status: DC | PRN
  Administered 2016-12-20: 30 mL

## 2016-12-20 MED ORDER — DIPHENHYDRAMINE HCL 50 MG/ML IJ SOLN: 12.5000 mg | Freq: Four times a day (QID) | INTRAMUSCULAR | Status: DC | PRN

## 2016-12-20 MED ORDER — ACETAMINOPHEN 650 MG RE SUPP: 650.0000 mg | Freq: Four times a day (QID) | RECTAL | Status: DC | PRN

## 2016-12-20 MED ORDER — DEXAMETHASONE SODIUM PHOSPHATE 10 MG/ML IJ SOLN
INTRAMUSCULAR | Status: AC
  Filled 2016-12-20: qty 1

## 2016-12-20 MED ORDER — PANTOPRAZOLE SODIUM 40 MG IV SOLR
40.0000 mg | Freq: Every day | INTRAVENOUS | Status: DC
  Administered 2016-12-20 – 2016-12-23 (×4): 40 mg via INTRAVENOUS
  Filled 2016-12-20 (×4): qty 40

## 2016-12-20 MED ORDER — FAMOTIDINE 20 MG PO TABS
ORAL_TABLET | ORAL | Status: AC
  Administered 2016-12-20: 20 mg via ORAL
  Filled 2016-12-20: qty 1

## 2016-12-20 MED ORDER — FAMOTIDINE 20 MG PO TABS
20.0000 mg | ORAL_TABLET | Freq: Once | ORAL | Status: AC
  Administered 2016-12-20: 20 mg via ORAL

## 2016-12-20 MED ORDER — BUPIVACAINE HCL (PF) 0.5 % IJ SOLN
INTRAMUSCULAR | Status: AC
  Filled 2016-12-20: qty 20

## 2016-12-20 MED ORDER — MORPHINE SULFATE (PF) 2 MG/ML IV SOLN
2.0000 mg | INTRAVENOUS | Status: DC | PRN
Start: 2016-12-20 — End: 2016-12-24

## 2016-12-20 MED ORDER — DEXAMETHASONE SODIUM PHOSPHATE 10 MG/ML IJ SOLN
INTRAMUSCULAR | Status: DC | PRN
  Administered 2016-12-20: 10 mg via INTRAVENOUS

## 2016-12-20 MED ORDER — LIDOCAINE HCL (PF) 2 % IJ SOLN
INTRAMUSCULAR | Status: AC
  Filled 2016-12-20: qty 2

## 2016-12-20 MED ORDER — PROPOFOL 10 MG/ML IV BOLUS
INTRAVENOUS | Status: AC
  Filled 2016-12-20: qty 20

## 2016-12-20 MED ORDER — SODIUM CHLORIDE 0.9 % IV SOLN
1.0000 g | INTRAVENOUS | Status: AC
  Administered 2016-12-20: 1 g via INTRAVENOUS
  Filled 2016-12-20: qty 1

## 2016-12-20 MED ORDER — ENOXAPARIN SODIUM 40 MG/0.4ML ~~LOC~~ SOLN
40.0000 mg | SUBCUTANEOUS | Status: DC
  Administered 2016-12-21 – 2016-12-24 (×4): 40 mg via SUBCUTANEOUS
  Filled 2016-12-20 (×4): qty 0.4

## 2016-12-20 MED ORDER — HYDROMORPHONE HCL 1 MG/ML IJ SOLN
INTRAMUSCULAR | Status: AC
  Filled 2016-12-20: qty 1

## 2016-12-20 MED ORDER — ALVIMOPAN 12 MG PO CAPS
12.0000 mg | ORAL_CAPSULE | Freq: Two times a day (BID) | ORAL | Status: DC
  Administered 2016-12-21 – 2016-12-22 (×4): 12 mg via ORAL
  Filled 2016-12-20 (×5): qty 1

## 2016-12-20 MED ORDER — PROMETHAZINE HCL 25 MG/ML IJ SOLN: 6.2500 mg | INTRAMUSCULAR | Status: DC | PRN

## 2016-12-20 MED ORDER — ALVIMOPAN 12 MG PO CAPS
12.0000 mg | ORAL_CAPSULE | Freq: Once | ORAL | Status: AC
  Administered 2016-12-20: 12 mg via ORAL

## 2016-12-20 MED ORDER — EPHEDRINE SULFATE 50 MG/ML IJ SOLN
INTRAMUSCULAR | Status: DC | PRN
  Administered 2016-12-20 (×2): 5 mg via INTRAVENOUS

## 2016-12-20 MED ORDER — ACETAMINOPHEN 10 MG/ML IV SOLN
INTRAVENOUS | Status: AC
  Filled 2016-12-20: qty 100

## 2016-12-20 MED ORDER — ZOLPIDEM TARTRATE 5 MG PO TABS: 5.0000 mg | ORAL_TABLET | Freq: Every evening | ORAL | Status: DC | PRN

## 2016-12-20 MED ORDER — DIPHENHYDRAMINE HCL 12.5 MG/5ML PO ELIX: 12.5000 mg | ORAL_SOLUTION | Freq: Four times a day (QID) | ORAL | Status: DC | PRN

## 2016-12-20 MED ORDER — DEXTROSE-NACL 5-0.45 % IV SOLN
INTRAVENOUS | Status: DC
  Administered 2016-12-20 – 2016-12-23 (×8): via INTRAVENOUS

## 2016-12-20 MED ORDER — ALVIMOPAN 12 MG PO CAPS
ORAL_CAPSULE | ORAL | Status: AC
  Administered 2016-12-20: 12 mg via ORAL
  Filled 2016-12-20: qty 1

## 2016-12-20 MED ORDER — HYDROMORPHONE HCL 1 MG/ML IJ SOLN
INTRAMUSCULAR | Status: DC | PRN
  Administered 2016-12-20: .2 mg via INTRAVENOUS

## 2016-12-20 MED ORDER — ACETAMINOPHEN 10 MG/ML IV SOLN
INTRAVENOUS | Status: DC | PRN
  Administered 2016-12-20: 1000 mg via INTRAVENOUS

## 2016-12-20 MED ORDER — ACETAMINOPHEN 325 MG PO TABS
650.0000 mg | ORAL_TABLET | Freq: Four times a day (QID) | ORAL | Status: DC | PRN
  Administered 2016-12-20 – 2016-12-22 (×3): 650 mg via ORAL
  Filled 2016-12-20 (×3): qty 2

## 2016-12-20 MED ORDER — SUGAMMADEX SODIUM 200 MG/2ML IV SOLN
INTRAVENOUS | Status: AC
  Filled 2016-12-20: qty 2

## 2016-12-20 MED ORDER — FENTANYL CITRATE (PF) 250 MCG/5ML IJ SOLN
INTRAMUSCULAR | Status: AC
  Filled 2016-12-20: qty 5

## 2016-12-20 MED ORDER — ONDANSETRON HCL 4 MG/2ML IJ SOLN
INTRAMUSCULAR | Status: AC
  Filled 2016-12-20: qty 2

## 2016-12-20 MED ORDER — ONDANSETRON HCL 4 MG/2ML IJ SOLN: 4.0000 mg | Freq: Four times a day (QID) | INTRAMUSCULAR | Status: DC | PRN

## 2016-12-20 MED ORDER — LACTATED RINGERS IV SOLN
INTRAVENOUS | Status: DC
  Administered 2016-12-20: 07:00:00 via INTRAVENOUS
  Administered 2016-12-20: 1000 mL via INTRAVENOUS

## 2016-12-20 MED ORDER — FENTANYL CITRATE (PF) 100 MCG/2ML IJ SOLN
25.0000 ug | INTRAMUSCULAR | Status: AC | PRN
  Administered 2016-12-20 (×6): 25 ug via INTRAVENOUS

## 2016-12-20 MED ORDER — LIDOCAINE HCL (CARDIAC) 20 MG/ML IV SOLN
INTRAVENOUS | Status: DC | PRN
  Administered 2016-12-20: 80 mg via INTRAVENOUS

## 2016-12-20 MED ORDER — ONDANSETRON 4 MG PO TBDP: 4.0000 mg | ORAL_TABLET | Freq: Four times a day (QID) | ORAL | Status: DC | PRN

## 2016-12-20 MED ORDER — BUPIVACAINE HCL (PF) 0.5 % IJ SOLN
INTRAMUSCULAR | Status: AC
  Filled 2016-12-20: qty 10

## 2016-12-20 MED ORDER — PROPOFOL 10 MG/ML IV BOLUS
INTRAVENOUS | Status: DC | PRN
  Administered 2016-12-20: 160 mg via INTRAVENOUS

## 2016-12-20 MED ORDER — MIDAZOLAM HCL 2 MG/2ML IJ SOLN
INTRAMUSCULAR | Status: AC
  Filled 2016-12-20: qty 2

## 2016-12-20 SURGICAL SUPPLY — 84 items
ADH SKN CLS APL DERMABOND .7 (GAUZE/BANDAGES/DRESSINGS) ×2
APPLIER CLIP ROT 10 11.4 M/L (STAPLE)
APR CLP MED LRG 11.4X10 (STAPLE)
BLADE SURG 10 STRL SS SAFETY (BLADE) ×2 IMPLANT
BLADE SURG 11 STRL SS SAFETY (MISCELLANEOUS) ×4 IMPLANT
CANISTER SUCT 1200ML W/VALVE (MISCELLANEOUS) ×4 IMPLANT
CANNULA DILATOR 10 W/SLV (CANNULA) ×2 IMPLANT
CANNULA DILATOR 10MM W/SLV (CANNULA)
CATH TRAY 16F METER LATEX (MISCELLANEOUS) ×2 IMPLANT
CHLORAPREP W/TINT 26ML (MISCELLANEOUS) ×4 IMPLANT
CLEANER CAUTERY TIP 5X5 PAD (MISCELLANEOUS) ×2 IMPLANT
CLIP APPLIE ROT 10 11.4 M/L (STAPLE) ×2 IMPLANT
CLOSURE WOUND 1/2 X4 (GAUZE/BANDAGES/DRESSINGS)
DECANTER SPIKE VIAL GLASS SM (MISCELLANEOUS) ×2 IMPLANT
DEFOGGER SCOPE WARMER CLEARIFY (MISCELLANEOUS) ×4 IMPLANT
DERMABOND ADVANCED (GAUZE/BANDAGES/DRESSINGS) ×2
DERMABOND ADVANCED .7 DNX12 (GAUZE/BANDAGES/DRESSINGS) IMPLANT
DEVICE HAND ACCESS DEXTUS (MISCELLANEOUS) IMPLANT
DRAPE INCISE IOBAN 66X45 STRL (DRAPES) ×4 IMPLANT
DRSG GAUZE PETRO 6X36 STRIP ST (GAUZE/BANDAGES/DRESSINGS) ×2 IMPLANT
DRSG OPSITE POSTOP 4X10 (GAUZE/BANDAGES/DRESSINGS) ×2 IMPLANT
DRSG OPSITE POSTOP 4X8 (GAUZE/BANDAGES/DRESSINGS) ×2 IMPLANT
DRSG TEGADERM 2-3/8X2-3/4 SM (GAUZE/BANDAGES/DRESSINGS) ×6 IMPLANT
DRSG TELFA 3X8 NADH (GAUZE/BANDAGES/DRESSINGS) IMPLANT
ELECT BLADE 6.5 EXT (BLADE) ×4 IMPLANT
ELECT REM PT RETURN 9FT ADLT (ELECTROSURGICAL) ×4
ELECTRODE REM PT RTRN 9FT ADLT (ELECTROSURGICAL) ×2 IMPLANT
FILTER LAP SMOKE EVAC STRL (MISCELLANEOUS) ×4 IMPLANT
GAUZE PETROLATUM 1 X8 (GAUZE/BANDAGES/DRESSINGS) ×2 IMPLANT
GLOVE BIO SURGEON STRL SZ7 (GLOVE) ×34 IMPLANT
GOWN STRL REUS W/ TWL LRG LVL3 (GOWN DISPOSABLE) ×12 IMPLANT
GOWN STRL REUS W/TWL LRG LVL3 (GOWN DISPOSABLE) ×32
HANDLE YANKAUER SUCT BULB TIP (MISCELLANEOUS) ×4 IMPLANT
IRRIGATION STRYKERFLOW (MISCELLANEOUS) IMPLANT
IRRIGATOR STRYKERFLOW (MISCELLANEOUS)
IV LACTATED RINGERS 1000ML (IV SOLUTION) IMPLANT
KIT PINK PAD W/HEAD ARE REST (MISCELLANEOUS) ×4
KIT PINK PAD W/HEAD ARM REST (MISCELLANEOUS) ×2 IMPLANT
KIT RM TURNOVER STRD PROC AR (KITS) ×4 IMPLANT
LABEL OR SOLS (LABEL) ×4 IMPLANT
NDL HPO THNWL 1X22GA REG BVL (NEEDLE) IMPLANT
NDL INSUFF ACCESS 14 VERSASTEP (NEEDLE) IMPLANT
NEEDLE SAFETY 22GX1 (NEEDLE) ×4
NS IRRIG 500ML POUR BTL (IV SOLUTION) ×4 IMPLANT
PACK COLON CLEAN CLOSURE (MISCELLANEOUS) ×4 IMPLANT
PACK LAP CHOLECYSTECTOMY (MISCELLANEOUS) ×4 IMPLANT
PAD CLEANER CAUTERY TIP 5X5 (MISCELLANEOUS) ×2
PAD DRESSING TELFA 3X8 NADH (GAUZE/BANDAGES/DRESSINGS) ×2 IMPLANT
PENCIL ELECTRO HAND CTR (MISCELLANEOUS) ×4 IMPLANT
PROT DEXTUS HAND ACCESS (MISCELLANEOUS)
RELOAD PROXIMATE 75MM BLUE (ENDOMECHANICALS) ×4 IMPLANT
RELOAD STAPLE 75 3.8 BLU REG (ENDOMECHANICALS) IMPLANT
RETRACTOR FIXED LENGTH SML (MISCELLANEOUS) IMPLANT
RETRACTOR WOUND ALXS 18CM MED (MISCELLANEOUS) IMPLANT
RETRACTOR WOUND ALXS 18CM SML (MISCELLANEOUS) IMPLANT
RTRCTR WOUND ALEXIS O 18CM MED (MISCELLANEOUS) ×4
RTRCTR WOUND ALEXIS O 18CM SML (MISCELLANEOUS)
SCISSORS METZENBAUM CVD 33 (INSTRUMENTS) ×4 IMPLANT
SET YANKAUER POOLE SUCT (MISCELLANEOUS) ×4 IMPLANT
SHEARS HARMONIC ACE PLUS 36CM (ENDOMECHANICALS) ×4 IMPLANT
SLEEVE ENDOPATH XCEL 5M (ENDOMECHANICALS) IMPLANT
SPONGE LAP 18X18 5 PK (GAUZE/BANDAGES/DRESSINGS) ×8 IMPLANT
STAPLER PROXIMATE 75MM BLUE (STAPLE) ×2 IMPLANT
STAPLER SKIN PROX 35W (STAPLE) IMPLANT
STRIP CLOSURE SKIN 1/2X4 (GAUZE/BANDAGES/DRESSINGS) ×2 IMPLANT
SURGILUBE 2OZ TUBE FLIPTOP (MISCELLANEOUS) IMPLANT
SUT PROLENE 0 CT 1 30 (SUTURE) ×16 IMPLANT
SUT SILK 2 0 (SUTURE) ×4
SUT SILK 2-0 18XBRD TIE 12 (SUTURE) ×2 IMPLANT
SUT SILK 3-0 (SUTURE) ×6 IMPLANT
SUT VIC AB 0 CT1 36 (SUTURE) ×2 IMPLANT
SUT VIC AB 2-0 CT1 27 (SUTURE)
SUT VIC AB 2-0 CT1 TAPERPNT 27 (SUTURE) ×4 IMPLANT
SUT VIC AB 3-0 54X BRD REEL (SUTURE) ×4 IMPLANT
SUT VIC AB 3-0 BRD 54 (SUTURE) ×8
SUT VIC AB 3-0 SH 27 (SUTURE) ×8
SUT VIC AB 3-0 SH 27X BRD (SUTURE) ×4 IMPLANT
SUT VIC AB 4-0 FS2 27 (SUTURE) ×8 IMPLANT
SYR BULB IRRIG 60ML STRL (SYRINGE) ×2 IMPLANT
SYR CONTROL 10ML (SYRINGE) ×2 IMPLANT
TROCAR XCEL NON-BLD 11X100MML (ENDOMECHANICALS) ×4 IMPLANT
TROCAR XCEL NON-BLD 5MMX100MML (ENDOMECHANICALS) ×2 IMPLANT
TROCAR XCEL UNIV SLVE 11M 100M (ENDOMECHANICALS) ×8 IMPLANT
TUBING INSUFFLATOR HEATED (MISCELLANEOUS) ×4 IMPLANT

## 2016-12-20 NOTE — Transfer of Care (Signed)
Immediate Anesthesia Transfer of Care Note  Patient: Diana Rodriguez  Procedure(s) Performed: Procedure(s): LAPAROSCOPIC RIGHT COLECTOMY (Right) HERNIA REPAIR UMBILICAL ADULT  Patient Location: PACU  Anesthesia Type:General  Level of Consciousness: awake and alert   Airway & Oxygen Therapy: Patient Spontanous Breathing and Patient connected to face mask oxygen  Post-op Assessment: Report given to RN and Post -op Vital signs reviewed and stable  Post vital signs: Reviewed and stable  Last Vitals:  Vitals:   12/20/16 0611 12/20/16 1100  BP: 136/73 136/60  Pulse: 68 93  Resp: 18 (!) 22  Temp: 36.3 C 36.4 C    Last Pain:  Vitals:   12/20/16 0611  TempSrc: Tympanic         Complications: No apparent anesthesia complications

## 2016-12-20 NOTE — Op Note (Signed)
Preop diagnosis: Large polyp of the ascending colon.  Post op diagnosis: Large polyp of ascending colon, large umbilical hernia, adhesions  Operation: Laparoscopy repair of umbilical hernia and lysis of adhesions and right hemicolectomy  Surgeon: Mckinley Jewel  Assistant: Arvilla Meres RN, FA   Anesthesia: Gen.  Complications: None  EBL: 50 mL  Drains: None  Description: Patient was put to sleep in the supine position the operating table and the abdomen prepped and draped as sterile field. Timeout was performed. As noted the patient had a subtle fullness near the left lip of the umbilicus suspicious for a small hernia. A port site incision was made at this site initially and it turned out the patient had a fairly large umbilical hernia measuring about 5 cm of all.  Preperitoneal fat. To allow for this to be adequately mobilized incision was extended up and down a slightly and after the hernia protrusion was freed down to the fascial opening which measured a little lower fingerbreadth in size. The herniated fat was taken down with the use of clamps cut and ligated with 3-0 Vicryl. An attempt was made to place a catheter at this side but it was apparent that the patient has significant omental adhesions surrounding the umbilical area and therefore was decided to place the initial port in the left upper quadrant. Small port incision was made and the Veress needle position the peritoneal cavity verified of the hanging drop method. Pneumoperitoneum was obtained followed by an 11 mm port with a camera in place and epigastric 11 mm port was placed and with careful exposure of the area omental adhesions were then taken down from the mid abdominal area all the way down to the upper pelvis this occupied the time of approximately 50 minutes.The dissection was done with the use of harmonic device. After the omental adhesions were completely lysed some adhesions of the terminal ileum to the left lateral pelvic  wall were then taken down also with the use of the harmonic device. Subsequently the right colon was then mobilized from the right side and extended upward towards the hepatic flexure which was very close to the gallbladder bed with a previous cholecystectomy was performed. Dissection was continued until the right colon and the hepatic flexure in the proximal transverse colon were adequately mobilized. The duodenum was identified in the right upper quadrant area. The proximal transverse colon also had the some adhesions of the omentum tenting it towards the right colon which were then taken down to allow for proper delineation of all structures. At this point the laparoscopic portion was concluded ports were removed. The midline incision in the umbilicus was extended up and down for a short distance to allow placement of a hand within. A wound protector was then placed right colon was easily mobilized out through the opening. Palpation showed that there was a sizable to 3 cm polyp above the ileocecal valve. Decision was made to resect the right colon from the terminal ileum about 5 cm from the ileocecal valve and the proximal transverse colon the junction of the first and the middle thirds. Both sites were then freed of the mesenteric attachment and then the mesentery was scored and taken down with the use of the harmonic device except for the main right colic vessel and the large branch of the middle colic going to the right these were then separately ligated with 2-0 silk 2 and cut. A side-to-side anastomosis of the terminal ileum to the transverse colon was  then made with the use of from a GIA stapler. And the right colon was completely transected and thereby closing the remaining opening of the anastomosis with a second application of the GIA. The corners of the anastomotic side were then reinforced with 3-0 silk. The mesenteric opening was then closed with running 3-0 Vicryl stitch. Bowel was placed back into  the right side of the abdomen and the down cavity irrigated out with 500 mL of saline and suctioned out. Following this with the fresh gown and gloves and redraping the incision area closure was obtained. The 2 port sites in the epigastrium and left upper quadrant closed with subcuticular 4-0 Vicryl fascial opening at the main incision was closed with interrupted figure-of-eight stitches of 0 proline. Subcutaneous tissue was closed with running 3-0 Vicryl. And the skin closed with subcuticular 4-0 Vicryl. Dermabond was applied and all the incisions. Patient subsequently extubated and returned recovery room stable condition.Marland Kitchen

## 2016-12-20 NOTE — Anesthesia Procedure Notes (Signed)
Procedure Name: Intubation Date/Time: 12/20/2016 7:34 AM Performed by: Hedda Slade Pre-anesthesia Checklist: Patient identified, Emergency Drugs available, Suction available and Patient being monitored Patient Re-evaluated:Patient Re-evaluated prior to inductionOxygen Delivery Method: Circle system utilized Preoxygenation: Pre-oxygenation with 100% oxygen Intubation Type: IV induction Ventilation: Mask ventilation without difficulty Laryngoscope Size: Mac and 3 Grade View: Grade II Tube type: Oral Tube size: 7.0 mm Number of attempts: 1 Airway Equipment and Method: Stylet Placement Confirmation: ETT inserted through vocal cords under direct vision,  positive ETCO2 and breath sounds checked- equal and bilateral Secured at: 21 cm Tube secured with: Tape Dental Injury: Teeth and Oropharynx as per pre-operative assessment

## 2016-12-20 NOTE — Anesthesia Postprocedure Evaluation (Signed)
Anesthesia Post Note  Patient: Diana Rodriguez  Procedure(s) Performed: Procedure(s) (LRB): LAPAROSCOPIC RIGHT COLECTOMY (Right) HERNIA REPAIR UMBILICAL ADULT  Patient location during evaluation: PACU Anesthesia Type: General Level of consciousness: awake and alert and oriented Pain management: pain level controlled Vital Signs Assessment: post-procedure vital signs reviewed and stable Respiratory status: spontaneous breathing, nonlabored ventilation and respiratory function stable Cardiovascular status: blood pressure returned to baseline and stable Postop Assessment: no signs of nausea or vomiting Anesthetic complications: no     Last Vitals:  Vitals:   12/20/16 1230 12/20/16 1239  BP: (!) 142/71   Pulse: 83 82  Resp: 13 19  Temp:      Last Pain:  Vitals:   12/20/16 1239  TempSrc:   PainSc: 5                  Diana Rodriguez

## 2016-12-20 NOTE — Anesthesia Post-op Follow-up Note (Cosign Needed)
Anesthesia QCDR form completed.        

## 2016-12-20 NOTE — H&P (View-Only) (Signed)
Patient ID: Diana Rodriguez, female   DOB: 08/28/1956, 61 y.o.   MRN: [id number]  Chief Complaint  Patient presents with  . Pre-op Exam    HPI Diana Rodriguez is a 61 y.o. female here for a pre op visit for colectomy surgery scheduled for 12/20/16.  She had colonoscopy 3 mos ago and multiple polyps were removed,,except on very large one in ascending colon. Pt denies any abdominal or GI symptoms.. I have reviewed the history of present illness with the patient.  HPI  Past Medical History:  Diagnosis Date  . Anemia   . Osteoporosis     Past Surgical History:  Procedure Laterality Date  . ABDOMINAL HYSTERECTOMY    . APPENDECTOMY    . CHOLECYSTECTOMY    . COLONOSCOPY WITH PROPOFOL N/A 08/23/2016   Procedure: COLONOSCOPY WITH PROPOFOL;  Surgeon: Lollie Sails, MD;  Location: [facility] ENDOSCOPY;  Service: Endoscopy;  Laterality: N/A;  . DIAGNOSTIC LAPAROSCOPY    . LAPAROSCOPIC OOPHERECTOMY      Family History  Problem Relation Age of Onset  . Cancer Mother     lung  . Cancer Father     lung  . Cancer Paternal Aunt     breast cancer    Social History Social History  Substance Use Topics  . Smoking status: Former Smoker    Quit date: 11/29/1990  . Smokeless tobacco: Former Systems developer  . Alcohol use 0.0 oz/week    Allergies  Allergen Reactions  . Erythromycin Anaphylaxis  . Advil [Ibuprofen] Swelling  . Cephalexin Hives  . Doxycycline Hives  . Penicillins Hives    Has patient had a PCN reaction causing immediate rash, facial/tongue/throat swelling, SOB or lightheadedness with hypotension:unsure Has patient had a PCN reaction causing severe rash involving mucus membranes or skin necrosis:Yes Has patient had a PCN reaction that required hospitalization:No Has patient had a PCN reaction occurring within the last 10 years:No If all of the above answers are "NO", then may proceed with Cephalosporin use.   . Septra [Sulfamethoxazole-Trimethoprim] Hives  . Vicodin  [Hydrocodone-Acetaminophen] Hives    No current outpatient prescriptions on file.   No current facility-administered medications for this visit.     Review of Systems Review of Systems  Constitutional: Negative.   Respiratory: Negative.   Cardiovascular: Negative.     Blood pressure 130/82, pulse 74, resp. rate 14, height 5' (1.524 m), weight 172 lb (78 kg).  Physical Exam Physical Exam  Constitutional: She is oriented to person, place, and time. She appears well-developed and well-nourished.  Eyes: Conjunctivae are normal. No scleral icterus.  Neck: Neck supple.  Cardiovascular: Normal rate, regular rhythm and normal heart sounds.   Pulmonary/Chest: Effort normal and breath sounds normal.  Abdominal: Soft. Normal appearance and bowel sounds are normal. There is no hepatomegaly. There is no tenderness. No hernia.  Lymphadenopathy:    She has no cervical adenopathy.  Neurological: She is alert and oriented to person, place, and time.  Skin: Skin is warm and dry.  Psychiatric: She has a normal mood and affect.    Data Reviewed Prior note, colonoscopy  Assessment    Large polyp ascending colon.    Plan    As discussed before, will proceed with laparoscopy and right colectomy. This was explained in full to patient again.     We will proceed with patient's surgery as scheduled for 12-20-16 at [facility].   Patient has been asked to complete a bowel prep with antibiotics. Instructions were reviewed today.  This has been scribed by Lesly Rubenstein LPN    Christene Lye 12/13/2016, 4:48 PM

## 2016-12-20 NOTE — Interval H&P Note (Signed)
History and Physical Interval Note:  12/20/2016 6:58 AM  Diana Rodriguez  has presented today for surgery, with the diagnosis of POLYP ASCENDING COLON  The various methods of treatment have been discussed with the patient and family. After consideration of risks, benefits and other options for treatment, the patient has consented to  Procedure(s): LAPAROSCOPIC RIGHT COLECTOMY (Right) as a surgical intervention .  The patient's history has been reviewed, patient examined, no change in status, stable for surgery.  I have reviewed the patient's chart and labs.  Questions were answered to the patient's satisfaction.     Bellami Farrelly G

## 2016-12-20 NOTE — Anesthesia Preprocedure Evaluation (Signed)
Anesthesia Evaluation  Patient identified by MRN, date of birth, ID band Patient awake    Reviewed: Allergy & Precautions, NPO status , Patient's Chart, lab work & pertinent test results  History of Anesthesia Complications Negative for: history of anesthetic complications  Airway Mallampati: II  TM Distance: >3 FB Neck ROM: Full    Dental no notable dental hx.    Pulmonary neg sleep apnea, neg COPD, former smoker,    breath sounds clear to auscultation- rhonchi (-) wheezing      Cardiovascular Exercise Tolerance: Good (-) hypertension(-) CAD and (-) Past MI  Rhythm:Regular Rate:Normal - Systolic murmurs and - Diastolic murmurs    Neuro/Psych negative neurological ROS  negative psych ROS   GI/Hepatic Neg liver ROS, Colon cancer   Endo/Other  negative endocrine ROS  Renal/GU negative Renal ROS     Musculoskeletal negative musculoskeletal ROS (+)   Abdominal (+) + obese,   Peds  Hematology  (+) anemia ,   Anesthesia Other Findings Past Medical History: No date: Anemia No date: Complication of anesthesia     Comment: PT WAS TOLD SHE HAS A SMALL AIRWAY-PT GETS               SEVER SHAKES AFTER GENERAL ANESTHESIA No date: Osteoporosis    Reproductive/Obstetrics                             Anesthesia Physical  Anesthesia Plan  ASA: II  Anesthesia Plan: General   Post-op Pain Management:    Induction: Intravenous  Airway Management Planned: Oral ETT  Additional Equipment:   Intra-op Plan:   Post-operative Plan: Extubation in OR  Informed Consent: I have reviewed the patients History and Physical, chart, labs and discussed the procedure including the risks, benefits and alternatives for the proposed anesthesia with the patient or authorized representative who has indicated his/her understanding and acceptance.   Dental advisory given  Plan Discussed with: CRNA and  Anesthesiologist  Anesthesia Plan Comments:         Anesthesia Quick Evaluation

## 2016-12-21 ENCOUNTER — Encounter: Payer: Self-pay | Admitting: General Surgery

## 2016-12-21 LAB — CBC
HEMATOCRIT: 31.8 % — AB (ref 35.0–47.0)
Hemoglobin: 11.1 g/dL — ABNORMAL LOW (ref 12.0–16.0)
MCH: 31.5 pg (ref 26.0–34.0)
MCHC: 34.8 g/dL (ref 32.0–36.0)
MCV: 90.3 fL (ref 80.0–100.0)
PLATELETS: 236 10*3/uL (ref 150–440)
RBC: 3.52 MIL/uL — AB (ref 3.80–5.20)
RDW: 13 % (ref 11.5–14.5)
WBC: 14.7 10*3/uL — AB (ref 3.6–11.0)

## 2016-12-21 LAB — BASIC METABOLIC PANEL
Anion gap: 7 (ref 5–15)
BUN: 10 mg/dL (ref 6–20)
CHLORIDE: 108 mmol/L (ref 101–111)
CO2: 25 mmol/L (ref 22–32)
CREATININE: 0.59 mg/dL (ref 0.44–1.00)
Calcium: 8.2 mg/dL — ABNORMAL LOW (ref 8.9–10.3)
GFR calc Af Amer: >60 mL/min (ref >60)
GFR calc non Af Amer: >60 mL/min (ref >60)
Glucose, Bld: 161 mg/dL — ABNORMAL HIGH (ref 65–99)
POTASSIUM: 4.1 mmol/L (ref 3.5–5.1)
SODIUM: 140 mmol/L (ref 135–145)

## 2016-12-21 MED ORDER — ALUM & MAG HYDROXIDE-SIMETH 200-200-20 MG/5ML PO SUSP
15.0000 mL | ORAL | Status: DC | PRN
  Administered 2016-12-21: 15 mL via ORAL
  Filled 2016-12-21: qty 30

## 2016-12-21 NOTE — Progress Notes (Signed)
Patient ID: Diana Rodriguez, female   DOB: January 23, 1956, 61 y.o.   MRN: [id number] Patient has no complaints and states she is doing well. Has been ambulating freely around the unit multiple times.   AVSS  Good urine output, IV fluids reduced to 100 mL/hr.   Incision sites look clean, minimal erythema at the lower end of midline incision.  Lung fields are CTAB. Abdomen is nontender, soft, and normoactive bowel sounds are heard.   Labs are normal.   Overall, stable course.

## 2016-12-22 LAB — CBC
HCT: 32.8 % — ABNORMAL LOW (ref 35.0–47.0)
Hemoglobin: 11 g/dL — ABNORMAL LOW (ref 12.0–16.0)
MCH: 30.5 pg (ref 26.0–34.0)
MCHC: 33.7 g/dL (ref 32.0–36.0)
MCV: 90.6 fL (ref 80.0–100.0)
Platelets: 222 10*3/uL (ref 150–440)
RBC: 3.61 MIL/uL — ABNORMAL LOW (ref 3.80–5.20)
RDW: 13 % (ref 11.5–14.5)
WBC: 15.4 10*3/uL — ABNORMAL HIGH (ref 3.6–11.0)

## 2016-12-22 LAB — SURGICAL PATHOLOGY

## 2016-12-22 MED ORDER — BISACODYL 10 MG RE SUPP
10.0000 mg | Freq: Once | RECTAL | Status: DC
  Filled 2016-12-22: qty 1

## 2016-12-22 NOTE — Progress Notes (Signed)
Patient ID: Diana Rodriguez, female   DOB: [birth year], 62 y.o.   MRN: [id number] Patient states that she was feeling tightness and fullness in her abdomen yesterday afternoon. Was given Maalox which helped. States she is feeling better this morning. Has ambulated around the unit already this morning.  Pt's temp was slightly elevated at 99.2, HR was 90, B/P has stayed within normal limits.  Abdomen is mildly distended, with hypoactive bowel sounds. Incision sites look clean.  Pt reported that she has not had a good bowel movement yet, only minor diarrhea yesterday. Dulcolax suppository ordered.   Will check CBC today. Continue with clear liquids for now.

## 2016-12-23 NOTE — Progress Notes (Signed)
Patient ID: Diana Rodriguez, female   DOB: 06-02-1956, 61 y.o.   MRN: [id number] Pt states she is doing better, bowels moved last night at 12am. No new complaints.  AVSS.  Abdomen is soft with active bowel sounds. Incision is intact and clean, skin redness and ecchymosis surrounding. Likely from marcaine injection.  Lungs are clear.   Advanced diet as tolerated and if stable, anticipate discharge tomorrow.   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 Nodes are negative and margins are clear. Patient advised.

## 2016-12-24 NOTE — Progress Notes (Signed)
Patient discharge teaching given, including activity, diet, follow-up appoints, and medications. Patient verbalized understanding of all discharge instructions. IV access was d/c'd by student nurse Janett Billow. Vitals are stable. Skin is intact except as charted in most recent assessments. Pt to be escorted out by volunteer, to be driven home by husband.  Diana Rodriguez [employer]

## 2016-12-24 NOTE — Discharge Summary (Signed)
Physician Discharge Summary  Patient ID: Diana Rodriguez MRN: [id number] DOB/AGE: [birth year] 61 y.o.  Admit date: 12/20/2016 Discharge date: 12/24/2016  Admission Diagnoses:Polyp ascending colon  Discharge Diagnoses:  Active Problems:   Polyp of ascending colon   Discharged Condition: good  Hospital Course: This 61 year old female underwent a colonoscopy about 3 months ago. Multiple polyps removed but one large one in the ascending colon above the ileocecal valve could not be dealt with via colonoscope. 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 Formal surgical resection was recommended patient subsequently was brought in for the same. Patient is in otherwise good health. On 12/20/2016 she underwent the laparoscopy and right hemicolectomy. Significant adhesion of the omentum and the lower abdomen was encountered extending from the umbilicus down and required lysis extending over 2 minute.. She also had an umbilical hernia that was dealt with at that time. Postoperative course was basically uncomplicated she was somewhat uncomfortable with old gaseous distention on second postop day but subsequently had return and good bowel function. The time of discharge the patient is tolerating solid food abdomen is soft with good bowel sounds and a bowels are working. She remains afebrile with stable vital signs. 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 Margins and all nodes were negative.  Consults: None  Significant Diagnostic Studies: None  Treatments: surgery: Laparoscopy right hemicolectomy lysis of adhesion and repair of umbilical hernia  Discharge Exam: Blood pressure (!) 138/58, pulse 85, temperature 98 F (36.7 C), temperature source Oral, resp. rate 20, height 5' (1.524 m), weight 172 lb (78 kg), SpO2 96 %. GI: soft, non-tender; bowel sounds normal; no masses,  no organomegaly incision is clean and intact with minimal ecchymosis surrounding  resulting from the Marcaine injection  Disposition: 01-Home or Self Care  Discharge Instructions    Call MD for:  persistant nausea and vomiting    Complete by:  As directed    Call MD for:  redness, tenderness, or signs of infection (pain, swelling, redness, odor or green/yellow discharge around incision site)    Complete by:  As directed    Call MD for:  severe uncontrolled pain    Complete by:  As directed    Call MD for:  temperature >100.4    Complete by:  As directed    Diet - low sodium heart healthy    Complete by:  As directed    Discharge instructions    Complete by:  As directed    May shower.  No exertional activity     Allergies as of 12/24/2016      Reactions   Erythromycin Anaphylaxis   Advil [ibuprofen] Swelling   Cephalexin Hives   Doxycycline Hives   Penicillins Hives   Has patient had a PCN reaction causing immediate rash, facial/tongue/throat swelling, SOB or lightheadedness with hypotension:unsure Has patient had a PCN reaction causing severe rash involving mucus membranes or skin necrosis:Yes Has patient had a PCN reaction that required hospitalization:No Has patient had a PCN reaction occurring within the last 10 years:No If all of the above answers are "NO", then may proceed with Cephalosporin use.   Septra [sulfamethoxazole-trimethoprim] Hives   Vicodin [hydrocodone-acetaminophen] Hives      Medication List    STOP taking these medications   neomycin 500 MG tablet Commonly known as:  MYCIFRADIN   polyethylene glycol powder powder Commonly known as:  GLYCOLAX/MIRALAX      Follow-up Information    Christene Lye, MD Follow up in  1 week(s).   Specialties:  General Surgery, Radiology Contact information: [address] [place] [address] [phone]           Signed: Christene Lye 12/24/2016, 9:52 AM

## 2016-12-26 ENCOUNTER — Telehealth: Payer: Self-pay | Admitting: General Surgery

## 2016-12-26 DIAGNOSIS — N3000 Acute cystitis without hematuria: Secondary | ICD-10-CM

## 2016-12-26 MED ORDER — NITROFURANTOIN MONOHYD MACRO 100 MG PO CAPS: 100.0000 mg | ORAL_CAPSULE | Freq: Two times a day (BID) | ORAL | 0 refills | Status: DC

## 2016-12-26 NOTE — Telephone Encounter (Signed)
The patient called  To report that she had developed some symptoms suggestive of a UTI last night, worse this morning.  Review of the operative record from January 22 at the time of her right colectom showed no Foley catheter was utilized.  The patient has multiple medical allergies including penicillins, quinolones and sulfa.  A prescription for nitrofurantoin will be sent to her pharmacy. She has been asked to come to office for urine dipstick in the morning.  The patient was asked to make use of daily yogurt or a probiotic to minimize GI side effects. She denies difficulty with frequent vaginitis after oral antibiotics.

## 2016-12-27 ENCOUNTER — Ambulatory Visit (INDEPENDENT_AMBULATORY_CARE_PROVIDER_SITE_OTHER): Payer: 59

## 2016-12-27 ENCOUNTER — Other Ambulatory Visit: Payer: Self-pay | Admitting: *Deleted

## 2016-12-27 DIAGNOSIS — R300 Dysuria: Secondary | ICD-10-CM

## 2016-12-27 LAB — POCT URINALYSIS DIPSTICK
BILIRUBIN UA: NEGATIVE
Blood, UA: NEGATIVE
GLUCOSE UA: NEGATIVE
Ketones, UA: NEGATIVE
LEUKOCYTES UA: NEGATIVE
NITRITE UA: NEGATIVE
Protein, UA: 15
Spec Grav, UA: 1.015
UROBILINOGEN UA: 0.2
pH, UA: 6

## 2016-12-27 NOTE — Patient Outreach (Signed)
[facility]) Care Management  12/27/2016  EMERITA BONIS 12-19-1955 [id number]  Completed transition of care-discharged 1/26  Rn spoke with pt today and introduced the [facility] program and purpose for today's call. Pt states she is doing well with no reported issues. RN verified pt's understanding of her discharge orders and review all medications with no delay or issues. Pt indicated UTI upon discharge and was provided an antibiotic that she continues to administer. RN completed the transition of care template and verified all follow up appointments (GI-Dr. Jamal Collin). No primary appointment needed at this time following this procedure. RN inquired if pt has initiated any of her benefits as pt reports she has started the process for her short term disability pending progress notes from her provider to extend past the initial 2 weeks given. Pt has also started her hospital indemnity plan pending bills from her recent hospitalization and has the proper forms to submit once the bills are received. RN confirmed pt is using the out patient pharmacy as well and aware of the benefits to stay within network for all her providers that are with [facility].   Transition of care template completed and pt very appreciative for the follow up today with no inquires or request. Pt provided the contact number on how to follow up with the [facility] office if any issues arise or needed resources. No other needs at this time.  Patient was recently discharged from hospital and all medications have been reviewed. Raina Mina, RN Care Management Coordinator [place] Office [phone]

## 2016-12-28 ENCOUNTER — Telehealth: Payer: Self-pay | Admitting: *Deleted

## 2016-12-28 NOTE — Telephone Encounter (Signed)
Notified patient as instructed, patient pleased. Discussed follow-up appointments, patient agrees  

## 2016-12-28 NOTE — Telephone Encounter (Signed)
-----   Message from Robert Bellow, MD sent at 12/28/2016 12:12 PM EST ----- Notify U/A OK. If she is free of symptoms, she can discontinue her Macrobid. Thanks.  ----- Message ----- From: Lesly Rubenstein, LPN Sent: [id number]  11:10 AM To: Robert Bellow, MD

## 2016-12-29 ENCOUNTER — Ambulatory Visit (INDEPENDENT_AMBULATORY_CARE_PROVIDER_SITE_OTHER): Payer: 59 | Admitting: General Surgery

## 2016-12-29 ENCOUNTER — Encounter: Payer: Self-pay | Admitting: General Surgery

## 2016-12-29 VITALS — BP 154/66 | HR 80 | Resp 14 | Ht 60.0 in | Wt 176.0 lb

## 2016-12-29 DIAGNOSIS — D122 Benign neoplasm of ascending colon: Secondary | ICD-10-CM

## 2016-12-29 NOTE — Patient Instructions (Addendum)
The patient is aware to call back for any questions or concerns. May return to normal diet.  Avoid strenuous physical activity for another week, then increase activity as tolerated. Will need colonoscopy in 2019.

## 2016-12-29 NOTE — Progress Notes (Signed)
Patient ID: Diana Rodriguez, female   DOB: 1956/10/31, 61 y.o.   MRN: [id number]  Chief Complaint  Patient presents with  . Routine Post Op    HPI Diana Rodriguez is a 61 y.o. female.  Here today for postoperative visit, she states she is doing well. Denies any gastrointestinal issues, bowels are moving regularly. Reports that on Tuesday (12/22/2015) she had a rash on her lower back and anterior thigh, likely allergic rxn from anesthetic or narcotic medications given at hospital. States that rash has since dissipated, but she is now experiencing numbness in her anterior thighs, which is more pronounced in the right. I have reviewed the history of present illness with the patient.'  HPI  Past Medical History:  Diagnosis Date  . Anemia   . Complication of anesthesia    PT WAS TOLD SHE HAS A SMALL AIRWAY-PT GETS SEVER SHAKES AFTER GENERAL ANESTHESIA  . Osteoporosis     Past Surgical History:  Procedure Laterality Date  . ABDOMINAL HYSTERECTOMY    . APPENDECTOMY    . CHOLECYSTECTOMY    . COLONOSCOPY WITH PROPOFOL N/A 08/23/2016   Procedure: COLONOSCOPY WITH PROPOFOL;  Surgeon: Lollie Sails, MD;  Location[facility] ENDOSCOPY;  Service: Endoscopy;  Laterality: N/A;  . DIAGNOSTIC LAPAROSCOPY    . LAPAROSCOPIC OOPHERECTOMY    . LAPAROSCOPIC RIGHT COLECTOMY Right 12/20/2016   Procedure: LAPAROSCOPIC RIGHT COLECTOMY;  Surgeon: Christene Lye, MD;  Location: [facility] ORS;  Service: General;  Laterality: Right;  . UMBILICAL HERNIA REPAIR  12/20/2016   Procedure: HERNIA REPAIR UMBILICAL ADULT;  Surgeon: Christene Lye, MD;  Location: [facility] ORS;  Service: General;;    Family History  Problem Relation Age of Onset  . Cancer Mother     lung  . Cancer Father     lung  . Cancer Paternal Aunt     breast cancer    Social History Social History  Substance Use Topics  . Smoking status: Former Smoker    Packs/day: 0.50    Years: 15.00    Types: Cigarettes    Quit date: 11/29/1990  .  Smokeless tobacco: Never Used  . Alcohol use 0.0 oz/week     Comment: OCC    Allergies  Allergen Reactions  . Erythromycin Anaphylaxis  . Advil [Ibuprofen] Swelling  . Cephalexin Hives  . Doxycycline Hives  . Penicillins Hives    Has patient had a PCN reaction causing immediate rash, facial/tongue/throat swelling, SOB or lightheadedness with hypotension:unsure Has patient had a PCN reaction causing severe rash involving mucus membranes or skin necrosis:Yes Has patient had a PCN reaction that required hospitalization:No Has patient had a PCN reaction occurring within the last 10 years:No If all of the above answers are "NO", then may proceed with Cephalosporin use.   . Septra [Sulfamethoxazole-Trimethoprim] Hives  . Vicodin [Hydrocodone-Acetaminophen] Hives    No current outpatient prescriptions on file.   No current facility-administered medications for this visit.     Review of Systems Review of Systems  Constitutional: Negative.   Respiratory: Negative.   Cardiovascular: Negative.   Gastrointestinal: Negative for abdominal pain, diarrhea and nausea.    Blood pressure (!) 154/66, pulse 80, resp. rate 14, height 5' (1.524 m), weight 176 lb (79.8 kg).  Physical Exam Physical Exam  Constitutional: She is oriented to person, place, and time. She appears well-developed and well-nourished.  Abdominal: Soft. Bowel sounds are normal.    Neurological: She is alert and oriented to person, place, and time.  Skin: Skin is warm and dry.  No rash is noted on the back.  Psychiatric: Her behavior is normal.    Data Reviewed Prior notes Path- tubulovillous adenoma with focal dysplasia, nodes negative Assessment    Post-operative right hemicolectomy. Incision and port sites are healing well, no signs of infection. Reported rash has resolved. No apparent explanation for the numbness in right anterior thigh.     Plan    Good progress so far. Follow-up in 4 weeks May return  to normal diet. Avoid strenuous physical activity for another 4-5 weeks. Advised patient that she will need colonoscopy in 2019.     This information has been scribed by Karie Fetch RN, BSN,BC.   SANKAR,SEEPLAPUTHUR G 12/29/2016, 11:34 AM

## 2017-01-05 ENCOUNTER — Telehealth: Payer: Self-pay | Admitting: *Deleted

## 2017-01-05 NOTE — Telephone Encounter (Signed)
Patient wants you to call her back regarding disability papers. She is getting the run around about when they received the papers from [place].

## 2017-01-05 NOTE — Telephone Encounter (Signed)
She states that the disability forms are complete, nothing to be done at this time. RTW 01-31-17

## 2017-01-27 ENCOUNTER — Ambulatory Visit (INDEPENDENT_AMBULATORY_CARE_PROVIDER_SITE_OTHER): Payer: 59 | Admitting: General Surgery

## 2017-01-27 ENCOUNTER — Encounter: Payer: Self-pay | Admitting: General Surgery

## 2017-01-27 VITALS — BP 130/74 | HR 72 | Resp 12 | Ht 64.0 in | Wt 177.0 lb

## 2017-01-27 DIAGNOSIS — D122 Benign neoplasm of ascending colon: Secondary | ICD-10-CM

## 2017-01-27 NOTE — Progress Notes (Signed)
Patient ID: Diana Rodriguez, female   DOB: 1956/08/17, 61 y.o.   MRN: [id number]  Chief Complaint  Patient presents with  . Follow-up    HPI Diana Rodriguez is a 61 y.o. female  here today for postoperative visit, she states she is doing well. She underwent a laparoscopy, colectomy on 12/20/16. She still reports some numbness and tingling in the right thigh at the site of the grounding pad. Denies any gastrointestinal issues, bowels are moving regularly. I have reviewed the history of present illness with the patient.  HPI  Past Medical History:  Diagnosis Date  . Anemia   . Complication of anesthesia    PT WAS TOLD SHE HAS A SMALL AIRWAY-PT GETS SEVER SHAKES AFTER GENERAL ANESTHESIA  . Osteoporosis     Past Surgical History:  Procedure Laterality Date  . ABDOMINAL HYSTERECTOMY    . APPENDECTOMY    . CHOLECYSTECTOMY    . COLONOSCOPY WITH PROPOFOL N/A 08/23/2016   Procedure: COLONOSCOPY WITH PROPOFOL;  Surgeon: Lollie Sails, MD;  Location: [facility] ENDOSCOPY;  Service: Endoscopy;  Laterality: N/A;  . DIAGNOSTIC LAPAROSCOPY    . LAPAROSCOPIC OOPHERECTOMY    . LAPAROSCOPIC RIGHT COLECTOMY Right 12/20/2016   Procedure: LAPAROSCOPIC RIGHT COLECTOMY;  Surgeon: Christene Lye, MD;  Location: [facility] ORS;  Service: General;  Laterality: Right;  . UMBILICAL HERNIA REPAIR  12/20/2016   Procedure: HERNIA REPAIR UMBILICAL ADULT;  Surgeon: Christene Lye, MD;  Location: [facility] ORS;  Service: General;;    Family History  Problem Relation Age of Onset  . Cancer Mother 71    lung  . Cancer Father 49    lung  . Cancer Paternal Aunt 29    breast and colon cancer  . Ovarian cancer Paternal Aunt 3    ovarian and colon  . Breast cancer Paternal Aunt 57    Social History Social History  Substance Use Topics  . Smoking status: Former Smoker    Packs/day: 0.50    Years: 15.00    Types: Cigarettes    Quit date: 11/29/1990  . Smokeless tobacco: Never Used  . Alcohol use 0.0 oz/week      Comment: OCC    Allergies  Allergen Reactions  . Erythromycin Anaphylaxis  . Advil [Ibuprofen] Swelling  . Cephalexin Hives  . Doxycycline Hives  . Penicillins Hives    Has patient had a PCN reaction causing immediate rash, facial/tongue/throat swelling, SOB or lightheadedness with hypotension:unsure Has patient had a PCN reaction causing severe rash involving mucus membranes or skin necrosis:Yes Has patient had a PCN reaction that required hospitalization:No Has patient had a PCN reaction occurring within the last 10 years:No If all of the above answers are "NO", then may proceed with Cephalosporin use.   . Septra [Sulfamethoxazole-Trimethoprim] Hives  . Vicodin [Hydrocodone-Acetaminophen] Hives    No current outpatient prescriptions on file.   No current facility-administered medications for this visit.     Review of Systems Review of Systems  Constitutional: Negative.   Respiratory: Negative.   Cardiovascular: Negative.     Blood pressure 130/74, pulse 72, resp. rate 12, height 5\' 4"  (1.626 m), weight 177 lb (80.3 kg).  Physical Exam Physical Exam  Constitutional: She is oriented to person, place, and time. She appears well-developed and well-nourished.  Cardiovascular: Normal rate, regular rhythm and normal heart sounds.   Pulmonary/Chest: Effort normal and breath sounds normal.  Abdominal: Soft. Bowel sounds are normal. There is no tenderness.  Neurological: She is alert and  oriented to person, place, and time.  Skin: Skin is warm and dry.  Abdominal incision and port sites look clean and healed.   Data Reviewed Prior notes   Assessment    Post-operative right hemicolectomy for large tubulovillous adenoma with focal dysplasia. Incision and port sites are well healed.     Plan    Follow up in August for colonoscopy .    Patient to return as needed and may return to normal activity.  This information has been scribed by Gaspar Cola  CMA.  Dyllan Hughett G 01/27/2017, 9:34 AM

## 2017-01-27 NOTE — Patient Instructions (Signed)
Patient to return as needed and may return to work on 01/31/2017.

## 2017-02-03 ENCOUNTER — Ambulatory Visit: Payer: Self-pay

## 2017-02-09 ENCOUNTER — Ambulatory Visit: Payer: Self-pay

## 2017-02-13 ENCOUNTER — Emergency Department: Payer: 59

## 2017-02-13 ENCOUNTER — Encounter: Payer: Self-pay | Admitting: Emergency Medicine

## 2017-02-13 ENCOUNTER — Ambulatory Visit (INDEPENDENT_AMBULATORY_CARE_PROVIDER_SITE_OTHER)
Admission: EM | Admit: 2017-02-13 | Discharge: 2017-02-13 | Disposition: A | Discharge status: Other Acute Inpatient Hospital | Payer: 59 | Source: Home / Self Care | Attending: Family Medicine | Admitting: Family Medicine

## 2017-02-13 ENCOUNTER — Observation Stay
Admission: EM | Admit: 2017-02-13 | Discharge: 2017-02-15 | Disposition: A | Discharge status: Home / Self Care | Payer: 59 | Attending: General Surgery | Admitting: General Surgery

## 2017-02-13 DIAGNOSIS — Z881 Allergy status to other antibiotic agents status: Secondary | ICD-10-CM | POA: Diagnosis not present

## 2017-02-13 DIAGNOSIS — R945 Abnormal results of liver function studies: Secondary | ICD-10-CM

## 2017-02-13 DIAGNOSIS — Z87891 Personal history of nicotine dependence: Secondary | ICD-10-CM | POA: Diagnosis not present

## 2017-02-13 DIAGNOSIS — R109 Unspecified abdominal pain: Secondary | ICD-10-CM | POA: Diagnosis not present

## 2017-02-13 DIAGNOSIS — K8050 Calculus of bile duct without cholangitis or cholecystitis without obstruction: Secondary | ICD-10-CM | POA: Diagnosis present

## 2017-02-13 DIAGNOSIS — R079 Chest pain, unspecified: Secondary | ICD-10-CM

## 2017-02-13 DIAGNOSIS — R7989 Other specified abnormal findings of blood chemistry: Secondary | ICD-10-CM

## 2017-02-13 DIAGNOSIS — Z886 Allergy status to analgesic agent status: Secondary | ICD-10-CM | POA: Diagnosis not present

## 2017-02-13 DIAGNOSIS — K859 Acute pancreatitis without necrosis or infection, unspecified: Secondary | ICD-10-CM | POA: Insufficient documentation

## 2017-02-13 DIAGNOSIS — Z9049 Acquired absence of other specified parts of digestive tract: Secondary | ICD-10-CM | POA: Insufficient documentation

## 2017-02-13 DIAGNOSIS — Z882 Allergy status to sulfonamides status: Secondary | ICD-10-CM | POA: Diagnosis not present

## 2017-02-13 DIAGNOSIS — Z885 Allergy status to narcotic agent status: Secondary | ICD-10-CM | POA: Diagnosis not present

## 2017-02-13 DIAGNOSIS — Z8249 Family history of ischemic heart disease and other diseases of the circulatory system: Secondary | ICD-10-CM | POA: Diagnosis not present

## 2017-02-13 DIAGNOSIS — K8051 Calculus of bile duct without cholangitis or cholecystitis with obstruction: Secondary | ICD-10-CM | POA: Diagnosis not present

## 2017-02-13 DIAGNOSIS — R1013 Epigastric pain: Secondary | ICD-10-CM | POA: Diagnosis not present

## 2017-02-13 DIAGNOSIS — R110 Nausea: Secondary | ICD-10-CM

## 2017-02-13 DIAGNOSIS — K8033 Calculus of bile duct with acute cholangitis with obstruction: Secondary | ICD-10-CM

## 2017-02-13 DIAGNOSIS — K838 Other specified diseases of biliary tract: Secondary | ICD-10-CM | POA: Diagnosis not present

## 2017-02-13 DIAGNOSIS — Z880 Allergy status to penicillin: Secondary | ICD-10-CM | POA: Insufficient documentation

## 2017-02-13 HISTORY — DX: Polyp of colon: K63.5

## 2017-02-13 LAB — TROPONIN I

## 2017-02-13 LAB — HEPATIC FUNCTION PANEL
ALBUMIN: 4.1 g/dL (ref 3.5–5.0)
ALT: 640 U/L — AB (ref 14–54)
AST: 901 U/L — ABNORMAL HIGH (ref 15–41)
Alkaline Phosphatase: 139 U/L — ABNORMAL HIGH (ref 38–126)
Bilirubin, Direct: 1.3 mg/dL — ABNORMAL HIGH (ref 0.1–0.5)
Indirect Bilirubin: 1.4 mg/dL — ABNORMAL HIGH (ref 0.3–0.9)
TOTAL PROTEIN: 7.4 g/dL (ref 6.5–8.1)
Total Bilirubin: 2.7 mg/dL — ABNORMAL HIGH (ref 0.3–1.2)

## 2017-02-13 LAB — CBC
HCT: 36.7 % (ref 35.0–47.0)
Hemoglobin: 12.8 g/dL (ref 12.0–16.0)
MCH: 31 pg (ref 26.0–34.0)
MCHC: 34.7 g/dL (ref 32.0–36.0)
MCV: 89.2 fL (ref 80.0–100.0)
PLATELETS: 250 10*3/uL (ref 150–440)
RBC: 4.11 MIL/uL (ref 3.80–5.20)
RDW: 13.4 % (ref 11.5–14.5)
WBC: 5.4 10*3/uL (ref 3.6–11.0)

## 2017-02-13 LAB — BASIC METABOLIC PANEL
ANION GAP: 5 (ref 5–15)
BUN: 9 mg/dL (ref 6–20)
CALCIUM: 8.8 mg/dL — AB (ref 8.9–10.3)
CO2: 26 mmol/L (ref 22–32)
CREATININE: 0.66 mg/dL (ref 0.44–1.00)
Chloride: 106 mmol/L (ref 101–111)
Glucose, Bld: 112 mg/dL — ABNORMAL HIGH (ref 65–99)
Potassium: 3.9 mmol/L (ref 3.5–5.1)
SODIUM: 137 mmol/L (ref 135–145)

## 2017-02-13 LAB — LIPASE, BLOOD: Lipase: 27 U/L (ref 11–51)

## 2017-02-13 MED ORDER — TRAMADOL HCL 50 MG PO TABS
100.0000 mg | ORAL_TABLET | Freq: Four times a day (QID) | ORAL | Status: DC
  Administered 2017-02-13 – 2017-02-14 (×2): 100 mg via ORAL
  Filled 2017-02-13 (×4): qty 2

## 2017-02-13 MED ORDER — ONDANSETRON HCL 4 MG PO TABS: 4.0000 mg | ORAL_TABLET | Freq: Four times a day (QID) | ORAL | Status: DC | PRN

## 2017-02-13 MED ORDER — ACETAMINOPHEN 650 MG RE SUPP: 650.0000 mg | Freq: Four times a day (QID) | RECTAL | Status: DC | PRN

## 2017-02-13 MED ORDER — IOPAMIDOL (ISOVUE-300) INJECTION 61%
100.0000 mL | Freq: Once | INTRAVENOUS | Status: AC | PRN
  Administered 2017-02-13: 100 mL via INTRAVENOUS

## 2017-02-13 MED ORDER — SODIUM CHLORIDE 0.9% FLUSH
3.0000 mL | Freq: Two times a day (BID) | INTRAVENOUS | Status: DC
  Administered 2017-02-13 – 2017-02-15 (×3): 3 mL via INTRAVENOUS

## 2017-02-13 MED ORDER — FENTANYL CITRATE (PF) 100 MCG/2ML IJ SOLN
50.0000 ug | Freq: Once | INTRAMUSCULAR | Status: AC
  Administered 2017-02-13: 50 ug via INTRAVENOUS
  Filled 2017-02-13: qty 2

## 2017-02-13 MED ORDER — SODIUM CHLORIDE 0.9 % IV SOLN
250.0000 mL | INTRAVENOUS | Status: DC | PRN
  Administered 2017-02-14: 18:00:00 via INTRAVENOUS

## 2017-02-13 MED ORDER — ONDANSETRON HCL 4 MG/2ML IJ SOLN: 4.0000 mg | Freq: Four times a day (QID) | INTRAMUSCULAR | Status: DC | PRN

## 2017-02-13 MED ORDER — SODIUM CHLORIDE 0.9% FLUSH: 3.0000 mL | INTRAVENOUS | Status: DC | PRN

## 2017-02-13 MED ORDER — ONDANSETRON HCL 4 MG/2ML IJ SOLN
4.0000 mg | Freq: Once | INTRAMUSCULAR | Status: AC
  Administered 2017-02-13: 4 mg via INTRAVENOUS
  Filled 2017-02-13: qty 2

## 2017-02-13 MED ORDER — PROMETHAZINE HCL 25 MG/ML IJ SOLN: 12.5000 mg | Freq: Four times a day (QID) | INTRAMUSCULAR | Status: DC | PRN

## 2017-02-13 MED ORDER — ACETAMINOPHEN 325 MG PO TABS
650.0000 mg | ORAL_TABLET | Freq: Four times a day (QID) | ORAL | Status: DC | PRN
  Administered 2017-02-14: 650 mg via ORAL
  Filled 2017-02-13: qty 2

## 2017-02-13 MED ORDER — IOPAMIDOL (ISOVUE-300) INJECTION 61%
30.0000 mL | Freq: Once | INTRAVENOUS | Status: AC | PRN
  Administered 2017-02-13: 30 mL via ORAL

## 2017-02-13 NOTE — ED Provider Notes (Addendum)
[facility] Emergency Department Provider Note  ____________________________________________   I have reviewed the triage vital signs and the nursing notes.   HISTORY  Chief Complaint Chest Pain    HPI Diana Rodriguez is a 61 y.o. female who presents today complaining of epigastric abdominal pain which began last night. The sharp pain. Patient states that it gradually began around 8:00 yesterday. Radiated towards her chest. No shortness of breath. Not pleuritic. No chest pain. Pain was very specific in the epigastric abdominal region. Felt exactly like her prior gallbladder although her color was taken out 20 years ago. She has had no fever or chills. The pain persisted so she went to [facility] which time she was given fentanyl and nitroglycerin the pain completely went away. She denies any change in her chronic diarrhea.. Have a recent polypectomy      Past Medical History:  Diagnosis Date  . Anemia   . Colon polyps   . Complication of anesthesia    PT WAS TOLD SHE HAS A SMALL AIRWAY-PT GETS SEVER SHAKES AFTER GENERAL ANESTHESIA  . Osteoporosis     Patient Active Problem List   Diagnosis Date Noted  . Polyp of ascending colon 12/20/2016    Past Surgical History:  Procedure Laterality Date  . ABDOMINAL HYSTERECTOMY    . APPENDECTOMY    . CHOLECYSTECTOMY    . COLONOSCOPY WITH PROPOFOL N/A 08/23/2016   Procedure: COLONOSCOPY WITH PROPOFOL;  Surgeon: Lollie Sails, MD;  Location: [facility] ENDOSCOPY;  Service: Endoscopy;  Laterality: N/A;  . DIAGNOSTIC LAPAROSCOPY    . LAPAROSCOPIC OOPHERECTOMY    . LAPAROSCOPIC RIGHT COLECTOMY Right 12/20/2016   Procedure: LAPAROSCOPIC RIGHT COLECTOMY;  Surgeon: Christene Lye, MD;  Location: [facility] ORS;  Service: General;  Laterality: Right;  . UMBILICAL HERNIA REPAIR  12/20/2016   Procedure: HERNIA REPAIR UMBILICAL ADULT;  Surgeon: Christene Lye, MD;  Location: [facility] ORS;  Service: General;;    Prior to  Admission medications   Not on File    Allergies Erythromycin; Advil [ibuprofen]; Aspirin; Cephalexin; Doxycycline; Penicillins; Septra [sulfamethoxazole-trimethoprim]; and Vicodin [hydrocodone-acetaminophen]  Family History  Problem Relation Age of Onset  . Cancer Mother 71    lung  . Cancer Father 69    lung  . Cancer Paternal Aunt 69    breast and colon cancer  . Ovarian cancer Paternal Aunt 29    ovarian and colon  . Breast cancer Paternal Aunt 25    Social History Social History  Substance Use Topics  . Smoking status: Former Smoker    Packs/day: 0.50    Years: 15.00    Types: Cigarettes    Quit date: 11/29/1990  . Smokeless tobacco: Never Used  . Alcohol use 0.0 oz/week     Comment: OCC    Review of Systems Constitutional: No fever/chills Eyes: No visual changes. ENT: No sore throat. No stiff neck no neck pain Cardiovascular: Denies chest pain.See history of present illness Respiratory: Denies shortness of breath. Gastrointestinal:   no vomiting.  Chronic diarrhea.  No constipation. Genitourinary: Negative for dysuria. Musculoskeletal: Negative lower extremity swelling Skin: Negative for rash. Neurological: Negative for severe headaches, focal weakness or numbness. 10-point ROS otherwise negative.  ____________________________________________   PHYSICAL EXAM:  VITAL SIGNS: ED Triage Vitals  Enc Vitals Group     BP 02/13/17 1220 (!) 160/86     Pulse Rate 02/13/17 1220 (!) 57     Resp 02/13/17 1220 18     Temp 02/13/17 1220  97.8 F (36.6 C)     Temp Source 02/13/17 1220 Oral     SpO2 02/13/17 1220 97 %     Weight 02/13/17 1224 177 lb (80.3 kg)     Height 02/13/17 1224 5\' 4"  (1.626 m)     Head Circumference --      Peak Flow --      Pain Score 02/13/17 1224 0     Pain Loc --      Pain Edu? --      Excl. in [place]? --     Constitutional: Alert and oriented. Well appearing and in no acute distress. Eyes: Conjunctivae are normal. PERRL. EOMI. Head:  Atraumatic. Nose: No congestion/rhinnorhea. Mouth/Throat: Mucous membranes are moist.  Oropharynx non-erythematous. Neck: No stridor.   Nontender with no meningismus Cardiovascular: Normal rate, regular rhythm. Grossly normal heart sounds.  Good peripheral circulation. Respiratory: Normal respiratory effort.  No retractions. Lungs CTAB. Abdominal: Soft and Positive minimal tenderness to palpation very focal in the epigastric region. No distention. No guarding no rebound Back:  There is no focal tenderness or step off.  there is no midline tenderness there are no lesions noted. there is no CVA tenderness Musculoskeletal: No lower extremity tenderness, no upper extremity tenderness. No joint effusions, no DVT signs strong distal pulses no edema Neurologic:  Normal speech and language. No gross focal neurologic deficits are appreciated.  Skin:  Skin is warm, dry and intact. No rash noted. Psychiatric: Mood and affect are normal. Speech and behavior are normal.  ____________________________________________   LABS (all labs ordered are listed, but only abnormal results are displayed)  Labs Reviewed  BASIC METABOLIC PANEL - Abnormal; Notable for the following:       Result Value   Glucose, Bld 112 (*)    Calcium 8.8 (*)    All other components within normal limits  HEPATIC FUNCTION PANEL - Abnormal; Notable for the following:    AST 901 (*)    ALT 640 (*)    Alkaline Phosphatase 139 (*)    Total Bilirubin 2.7 (*)    Bilirubin, Direct 1.3 (*)    Indirect Bilirubin 1.4 (*)    All other components within normal limits  CBC  TROPONIN I  LIPASE, BLOOD  HEPATITIS PANEL, ACUTE   ____________________________________________  EKG  I personally interpreted any EKGs ordered by me or triage Normal sinus Riccardi rate 52 bpm no acute ST elevation or acute ST depression normal axis unremarkable EKG ____________________________________________  RADIOLOGY  I reviewed any imaging ordered by  me or triage that were performed during my shift and, if possible, patient and/or family made aware of any abnormal findings. ____________________________________________   PROCEDURES  Procedure(s) performed: None  Procedures  Critical Care performed: None  ____________________________________________   INITIAL IMPRESSION / ASSESSMENT AND PLAN / ED COURSE  Pertinent labs & imaging results that were available during my care of the patient were reviewed by me and considered in my medical decision making (see chart for details).  Patient here with epigastric abdominal pain. I have very low sisters per ACS and therefore and liver fracture tests and lipase. Those show acute elevation in her liver function tests but normal lipase. Concern therefore for her primary hepatic pathology with pain. Ultrasound of her liver is reassuring and patient does not have a gallbladder. We'll obtain CT scan to further evaluate. Patient has had very minimal discomfort here. We will dress the pain as a comes up. The patient's differential also includes hepatitis. We  have sent a hepatitis panel symptoms all began last night. Do not think serial cardiac enzymes are warranted for obvious reasons.  Clinical Course as of Feb 13 1530  Sun Feb 13, 2017  1432 EKG 12-Lead [WR]  1433 EKG 12-Lead [WR]  1433 EKG 12-Lead [WR]  1519 CT Abdomen Pelvis W Contrast [WR]  1520 CT Abdomen Pelvis W Contrast [WR]  1523 Assuming care from Dr. Burlene Arnt.  In short, Diana Rodriguez is a 61 y.o. female with a chief complaint of abdominal pain.  Refer to the original H&P for additional details.  The current plan of care is to follow up on the CT scan and likely admit for acute hepatitis.  [CF]    Clinical Course User Index [CF] Hinda Kehr, MD [WR] Lorin Picket, PA-C   ____________________________________________   FINAL CLINICAL IMPRESSION(S) / ED DIAGNOSES  Final diagnoses:  Abdominal pain      This chart was  dictated using voice recognition software.  Despite best efforts to proofread,  errors can occur which can change meaning.      Schuyler Amor, MD 02/13/17 [place], MD 02/13/17 [phone]

## 2017-02-13 NOTE — ED Provider Notes (Signed)
CSN: [id number]     Arrival date & time 02/13/17  1034 History   First MD Initiated Contact with Patient 02/13/17 1101     Chief Complaint  Patient presents with  . Abdominal Pain  . Diarrhea   (Consider location/radiation/quality/duration/timing/severity/associated sxs/prior Treatment) HPI  This a 61 year old female presents with epigastric and substernal chest pain that started last evening when she was at a fund raising party. He states that it was epigastric but would radiate up into her substernal region of her chest and  into her back. Having Nausea and needed to walk outside to get some fresh air. The pain kept her awake most of the evening and today continues to bother her so she came to our facility. She states that the pain is a constant 5 out of 10. Is not having any dyspnea. She has had no diaphoresis. Does mention that it feels exactly like it she had when she had her gallbladder removed. She has had a recent hemicolectomy about 8 weeks ago. She is afebrile pulse of 61 blood pressure 178/84 respirations 18 O2 sats at 99%. She is allergic to aspirin.       Past Medical History:  Diagnosis Date  . Anemia   . Colon polyps   . Complication of anesthesia    PT WAS TOLD SHE HAS A SMALL AIRWAY-PT GETS SEVER SHAKES AFTER GENERAL ANESTHESIA  . Osteoporosis    Past Surgical History:  Procedure Laterality Date  . ABDOMINAL HYSTERECTOMY    . APPENDECTOMY    . CHOLECYSTECTOMY    . COLONOSCOPY WITH PROPOFOL N/A 08/23/2016   Procedure: COLONOSCOPY WITH PROPOFOL;  Surgeon: Lollie Sails, MD;  Location: [facility] ENDOSCOPY;  Service: Endoscopy;  Laterality: N/A;  . DIAGNOSTIC LAPAROSCOPY    . LAPAROSCOPIC OOPHERECTOMY    . LAPAROSCOPIC RIGHT COLECTOMY Right 12/20/2016   Procedure: LAPAROSCOPIC RIGHT COLECTOMY;  Surgeon: Christene Lye, MD;  Location: [facility] ORS;  Service: General;  Laterality: Right;  . UMBILICAL HERNIA REPAIR  12/20/2016   Procedure: HERNIA REPAIR UMBILICAL ADULT;   Surgeon: Christene Lye, MD;  Location: [facility] ORS;  Service: General;;   Family History  Problem Relation Age of Onset  . Cancer Mother 65    lung  . Cancer Father 25    lung  . Cancer Paternal Aunt 38    breast and colon cancer  . Ovarian cancer Paternal Aunt 30    ovarian and colon  . Breast cancer Paternal Aunt 46   Social History  Substance Use Topics  . Smoking status: Former Smoker    Packs/day: 0.50    Years: 15.00    Types: Cigarettes    Quit date: 11/29/1990  . Smokeless tobacco: Never Used  . Alcohol use 0.0 oz/week     Comment: OCC   OB History    No data available     Review of Systems  Constitutional: Positive for activity change. Negative for chills, diaphoresis, fatigue and fever.  Respiratory: Positive for chest tightness.   Cardiovascular: Positive for chest pain.  All other systems reviewed and are negative.   Allergies  Erythromycin; Advil [ibuprofen]; Aspirin; Cephalexin; Doxycycline; Penicillins; Septra [sulfamethoxazole-trimethoprim]; and Vicodin [hydrocodone-acetaminophen]  Home Medications   Prior to Admission medications   Not on File   Meds Ordered and Administered this Visit  Medications - No data to display  BP (!) 178/84 (BP Location: Right Arm)   Pulse 61   Temp 97.9 F (36.6 C) (Oral)   Resp 18  Ht 5' (1.524 m)   Wt 177 lb (80.3 kg)   SpO2 99%   BMI 34.57 kg/m  No data found.   Physical Exam  Constitutional: She is oriented to person, place, and time. She appears well-developed and well-nourished. No distress.  HENT:  Head: Normocephalic and atraumatic.  Eyes: Pupils are equal, round, and reactive to light.  Neck: Normal range of motion.  Cardiovascular: Normal rate, regular rhythm and normal heart sounds.  Exam reveals no gallop and no friction rub.   No murmur heard. Pulmonary/Chest: Effort normal and breath sounds normal. No respiratory distress. She has no wheezes. She has no rales. She exhibits tenderness.   Abdominal: Soft. Bowel sounds are normal. She exhibits no distension. There is tenderness. There is no rebound and no guarding.  Assessment of the abdomen shows tenderness localized to the epigastric region however she does have tenderness along the sternum from the xiphoid to the manubrium.  Musculoskeletal: Normal range of motion.  Neurological: She is alert and oriented to person, place, and time.  Skin: Skin is warm and dry. She is not diaphoretic.  Psychiatric: She has a normal mood and affect. Her behavior is normal. Judgment and thought content normal.  Nursing note and vitals reviewed.   Urgent Care Course     Procedures (including critical care time)  Labs Review Labs Reviewed - No data to display  Imaging Review No results found.   Visual Acuity Review  Right Eye Distance:   Left Eye Distance:   Bilateral Distance:    Right Eye Near:   Left Eye Near:    Bilateral Near:     Saline lock was placed in the right antecubital fossa    MDM   1. Nausea   2. Chest pain with moderate risk for cardiac etiology    Positive for extensive past medical family history of coronary disease and her presenting symptoms and I'm concerned regarding a possible non-STEMI. EKG was normal as had unrelenting pain all night with a current 5 out of 10 pain. I recommended that she be transported by EMS to [facility] emergency department for further evaluation and treatment. She is agreeable to this. Her husband was at the bedside as we discussed all the above. Did not receive aspirin due to her allergy. Saline Lock was placed in the right antecubital fossa.    Lorin Picket, PA-C 02/13/17 1206

## 2017-02-13 NOTE — ED Triage Notes (Signed)
Pt started last p.m. With epigastric pain and nausea, has been burping and has had diarrhea. Feels like it did when she got her gallbladder out several years ago. Had colon surgery 8 weeks ago for polyps. Pain better when lying down. Pain 5/10.

## 2017-02-13 NOTE — ED Triage Notes (Addendum)
1 spray nitro,and fentanyl given, allergy to asa, was at [facility] urgent care and sent in for cp since last night. No ekg changes.

## 2017-02-13 NOTE — H&P (Signed)
Diana Rodriguez is an 61 y.o. female.   Chief Complaint: Epigastric pain since 8 PM last night.  HPI:  61 y/o woman who was well until 8 PM last night when she had the sudden onset of epigastric pain with radiation into the back "exactly the same as when I had my gallbladder out 23 years ago".  He has been persistent since that time. Nausea but no vomiting. No fevers or chills. He slept intermittently through the night without any ability to find any position of comfort.  The patient reports she has had 3-4 loose stools per day since her recent colectomy. Her diet has undergone no recent change and she has had no difficulty with eating any foods.  No fever or chills reported.  No recent illness. No other family members ill. No exposure to hepatitis.  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   No medicines, no smoking, no alcohol.  Past Medical History:  Diagnosis Date  . Anemia   . Colon polyps   . Complication of anesthesia    PT WAS TOLD SHE HAS A SMALL AIRWAY-PT GETS SEVER SHAKES AFTER GENERAL ANESTHESIA  . Osteoporosis     Past Surgical History:  Procedure Laterality Date  . ABDOMINAL HYSTERECTOMY    . APPENDECTOMY    . CHOLECYSTECTOMY    . COLONOSCOPY WITH PROPOFOL N/A 08/23/2016   Procedure: COLONOSCOPY WITH PROPOFOL;  Surgeon: Lollie Sails, MD;  Location: [facility] ENDOSCOPY;  Service: Endoscopy;  Laterality: N/A;  . DIAGNOSTIC LAPAROSCOPY    . LAPAROSCOPIC OOPHERECTOMY    . LAPAROSCOPIC RIGHT COLECTOMY Right 12/20/2016   Procedure: LAPAROSCOPIC RIGHT COLECTOMY;  Surgeon: Christene Lye, MD;  Location: [facility] ORS;  Service: General;  Laterality: Right;  . UMBILICAL HERNIA REPAIR  12/20/2016   Procedure: HERNIA REPAIR UMBILICAL ADULT;  Surgeon: Christene Lye, MD;  Location: [facility] ORS;  Service: General;;    Family History  Problem Relation Age of Onset  . Cancer Mother 25    lung  . Cancer Father 43    lung  . Cancer Paternal Aunt 51   breast and colon cancer  . Ovarian cancer Paternal Aunt 36    ovarian and colon  . Breast cancer Paternal Aunt 45   Social History:  reports that she quit smoking about 26 years ago. Her smoking use included Cigarettes. She has a 7.50 pack-year smoking history. She has never used smokeless tobacco. She reports that she drinks alcohol. She reports that she does not use drugs.  Allergies:  Allergies  Allergen Reactions  . Erythromycin Anaphylaxis  . Advil [Ibuprofen] Swelling  . Aspirin Other (See Comments)    Pt reports her father was allergic to aspirin and concerned she may be allergic also  . Cephalexin Hives  . Doxycycline Hives  . Penicillins Hives    Has patient had a PCN reaction causing immediate rash, facial/tongue/throat swelling, SOB or lightheadedness with hypotension:unsure Has patient had a PCN reaction causing severe rash involving mucus membranes or skin necrosis:Yes Has patient had a PCN reaction that required hospitalization:No Has patient had a PCN reaction occurring within the last 10 years:No If all of the above answers are "NO", then may proceed with Cephalosporin use.   . Septra [Sulfamethoxazole-Trimethoprim] Hives  . Vicodin [Hydrocodone-Acetaminophen] Hives     (Not in a hospital admission)  Results for orders placed or performed during the hospital encounter of 02/13/17 (from the past 48 hour(s))  Basic metabolic panel  Status: Abnormal   Collection Time: 02/13/17 12:27 PM  Result Value Ref Range   Sodium 137 135 - 145 mmol/L   Potassium 3.9 3.5 - 5.1 mmol/L   Chloride 106 101 - 111 mmol/L   CO2 26 22 - 32 mmol/L   Glucose, Bld 112 (H) 65 - 99 mg/dL   BUN 9 6 - 20 mg/dL   Creatinine, Ser 0.66 0.44 - 1.00 mg/dL   Calcium 8.8 (L) 8.9 - 10.3 mg/dL   GFR calc non Af Amer >60 >60 mL/min   GFR calc Af Amer >60 >60 mL/min    Comment: (NOTE) The eGFR has been calculated using the CKD EPI equation. This calculation has not been validated in all  clinical situations. eGFR's persistently <60 mL/min signify possible Chronic Kidney Disease.    Anion gap 5 5 - 15  CBC     Status: None   Collection Time: 02/13/17 12:27 PM  Result Value Ref Range   WBC 5.4 3.6 - 11.0 K/uL   RBC 4.11 3.80 - 5.20 MIL/uL   Hemoglobin 12.8 12.0 - 16.0 g/dL   HCT 36.7 35.0 - 47.0 %   MCV 89.2 80.0 - 100.0 fL   MCH 31.0 26.0 - 34.0 pg   MCHC 34.7 32.0 - 36.0 g/dL   RDW 13.4 11.5 - 14.5 %   Platelets 250 150 - 440 K/uL  Troponin I     Status: None   Collection Time: 02/13/17 12:27 PM  Result Value Ref Range   Troponin I <0.03 <0.03 ng/mL  Hepatic function panel     Status: Abnormal   Collection Time: 02/13/17 12:37 PM  Result Value Ref Range   Total Protein 7.4 6.5 - 8.1 g/dL   Albumin 4.1 3.5 - 5.0 g/dL   AST 901 (H) 15 - 41 U/L   ALT 640 (H) 14 - 54 U/L   Alkaline Phosphatase 139 (H) 38 - 126 U/L   Total Bilirubin 2.7 (H) 0.3 - 1.2 mg/dL   Bilirubin, Direct 1.3 (H) 0.1 - 0.5 mg/dL   Indirect Bilirubin 1.4 (H) 0.3 - 0.9 mg/dL  Lipase, blood     Status: None   Collection Time: 02/13/17 12:37 PM  Result Value Ref Range   Lipase 27 11 - 51 U/L   Dg Chest 2 View  Result Date: 02/13/2017 CLINICAL DATA:  61 year old female with history of midsternal chest pain since yesterday. EXAM: CHEST  2 VIEW COMPARISON:  No priors. FINDINGS: Lung volumes are normal. No consolidative airspace disease. No pleural effusions. No pneumothorax. No pulmonary nodule or mass noted. Pulmonary vasculature and the cardiomediastinal silhouette are within normal limits. Aortic atherosclerosis. Lateral view demonstrates old compression fractures of T6 and T9, most severe at T9 where there is approximately 50% loss of anterior vertebral body height, similar to the prior MRI of the thoracic spine 12/09/2004. Surgical clips project over the right upper quadrant of the abdomen, likely from prior cholecystectomy. IMPRESSION: 1. No radiographic evidence of acute cardiopulmonary disease.  2. Aortic atherosclerosis. 3. Old compression fractures of T6 and T9 appear unchanged. Electronically Signed   By: Vinnie Langton M.D.   On: 02/13/2017 12:56   Ct Abdomen Pelvis W Contrast  Result Date: 02/13/2017 CLINICAL DATA:  Chest pain since 8 p.m. last night. The examination was ordered with a history of abdominal pain and elevated liver function tests. Previous appendectomy and right colectomy approximately 8 weeks ago for colon cancer. History of breast cancer. EXAM: CT ABDOMEN AND PELVIS WITH  CONTRAST TECHNIQUE: Multidetector CT imaging of the abdomen and pelvis was performed using the standard protocol following bolus administration of intravenous contrast. CONTRAST:  154m ISOVUE-300 IOPAMIDOL (ISOVUE-300) INJECTION 61% COMPARISON:  Chest radiographs obtained earlier today. FINDINGS: Lower chest: Small amount of linear density at the left lung base. Minimal bilateral dependent atelectasis. Hepatobiliary: Diffuse low density of the liver relative to the spleen. 9 mm medial segment right lobe liver low density mass with questionable mild peripheral contrast puddling on the delayed images through the kidneys. Cholecystectomy clips. Pancreas: Unremarkable. No pancreatic ductal dilatation or surrounding inflammatory changes. Spleen: Normal in size without focal abnormality. Adrenals/Urinary Tract: Adrenal glands are unremarkable. Small right renal cyst. Otherwise, the kidneys are normal, without renal calculi, focal lesion, or hydronephrosis. Bladder is unremarkable. Stomach/Bowel: The inferior portion of the right colon is surgically absent. There is wall thickening and adjacent soft tissue stranding involving the remaining inferior portion of the right colon and small bowel at the anastomosis of the small bowel with the colon. No bowel dilatation. The stomach and small bowel are unremarkable. Vascular/Lymphatic: Mild atheromatous aortic calcifications. Mildly prominent right lower quadrant mesenteric  lymph node with a short axis diameter of 6 mm on image number 47. No abnormally enlarged lymph nodes are seen. Reproductive: Surgically absent uterus. Surgical clip in the region of the vaginal cuff on the left. No adnexal masses. Other: Oval area of fat density with surrounding soft tissue stranding in the right mid pelvis, anteriorly. This measures 4.0 x 1.4 cm on coronal image number 30. Musculoskeletal: Lumbar and lower thoracic spine degenerative changes. Approximately 40% T9 vertebral compression deformity without bony retropulsion and no acute fracture lines. IMPRESSION: 1. Status post partial right colectomy with wall thickening and inflammatory changes of the anastomosis, involving the colon and distal small bowel. Residual or recurrent malignancy could have this appearance. Infectious or inflammatory changes could also have this appearance. 2. 4.0 cm area of probable fat necrosis in the right pelvis. 3. 9 mm right lobe liver mass. This most likely represents a cyst or hemangioma. A solitary metastasis is less likely but not excluded. 4. Diffuse hepatic steatosis. 5. Probable minimally reactive right lower quadrant mesenteric lymph node. An early metastatic node is not excluded. Electronically Signed   By: SClaudie ReveringM.D.   On: 02/13/2017 16:02   [place]Abdomen Limited Ruq  Result Date: 02/13/2017 CLINICAL DATA:  61year old female bold with history of abdominal pain for 1 day. EXAM: [place]DOMEN LIMITED - RIGHT UPPER QUADRANT COMPARISON:  No priors. FINDINGS: Gallbladder: Status post cholecystectomy. Common bile duct: Diameter: 9.5 mm Liver: No focal lesion identified. Mild diffuse increased echogenicity throughout the hepatic parenchyma, suggesting a background of hepatic steatosis. IMPRESSION: 1. No acute findings. 2. Probable mild hepatic steatosis. 3. Status post cholecystectomy. Electronically Signed   By: DVinnie LangtonM.D.   On: 02/13/2017 14:22    ROS  Blood pressure 134/68, pulse (!) 59,  temperature 97.8 F (36.6 C), temperature source Oral, resp. rate 16, height _0  (1.626 m), weight 177 lb (80.3 kg), SpO2 99 %. Physical Exam  Constitutional: She is oriented to person, place, and time. She appears well-developed and well-nourished.  Eyes: Pupils are equal, round, and reactive to light.  Neck: Neck supple. No thyromegaly present.  Cardiovascular: Normal rate and regular rhythm.   Respiratory: Effort normal and breath sounds normal.  GI: Bowel sounds are normal. She exhibits no distension and no mass.    Neurological: She is alert and oriented to  person, place, and time.  Skin: Skin is warm and dry.    Data studies Review of the abdominal pelvic CT shows the common bile duct dilated 12.5 mm in the proximal intrahepatic portion and then becoming 3 mm in size. Laboratory changes in the area of recent surgery reviewed. Patient is nontender in this area. Abdominal ultrasound results reviewed. Assessment/Plan Clinical history and laboratory studies as well as CT suggested possibility of a common bile duct stone.  The patient has had current pain as the analgesics provided to the emergency Department of warm and off and for that reason she'll be kept overnight, arrangements are in place for an MRCP first thing in the morning and if a stone is identified will procedure rectally the ERCP for stone removal.  Bary Castilla Forest Gleason, MD 02/13/2017, 7:32 PM

## 2017-02-13 NOTE — Progress Notes (Signed)
Symptoms suggestive of biliary colic.   Persistent pain for 22 hours. Relief w/ meds. Will get MRCP in AM, possible ERCP tomorrow.

## 2017-02-13 NOTE — ED Provider Notes (Signed)
Clinical Course as of Feb 14 1831  Sun Feb 13, 2017  1432 EKG 12-Lead [WR]  1433 EKG 12-Lead [WR]  1433 EKG 12-Lead [WR]  1519 CT Abdomen Pelvis W Contrast [WR]  1520 CT Abdomen Pelvis W Contrast [WR]  1523 Assuming care from Dr. Burlene Arnt.  In short, Diana Rodriguez is a 61 y.o. female with a chief complaint of abdominal pain.  Refer to the original H&P for additional details.  The current plan of care is to follow up on the CT scan and likely admit for acute hepatitis.  [CF]  [place] Dr. Adonis Huguenin to discuss CT.  He is otherwise occupied at the moment but will call me back to discuss the CT scan.  Anticipate hospitalist admission.  [CF]  7342 I reviewed the chart and spoke with the patient and her husband.  She had surgery by Dr. Jamal Collin about two months ago.  I updated the patient and husband and paged Dr. Bary Castilla who is the on-call surgeon for [facility].  [CF]  1743 No call back from Dr. Bary Castilla.  Paging again.  [CF]  65 Spoke by phone with Dr. Bary Castilla, he is coming to the ED to evaluate the patient.  Updated patient and family.  [CF]  8768 Dr. Bary Castilla saw the patient in person in the ED and discussed the case with me.  He will admit but believes she most likely has post-cholecystectomy choledocholithiasis.  He will obtain a GI consult and further imaging and/or procedure as needed.  [CF]    Clinical Course User Index [CF] Hinda Kehr, MD [WR] Lorin Picket, PA-C      Hinda Kehr, MD 02/13/17 [phone]

## 2017-02-14 ENCOUNTER — Observation Stay: Payer: 59

## 2017-02-14 ENCOUNTER — Encounter
Admission: EM | Disposition: A | Discharge status: Home / Self Care | Payer: Self-pay | Source: Home / Self Care | Attending: Emergency Medicine

## 2017-02-14 ENCOUNTER — Observation Stay: Payer: 59 | Admitting: Registered Nurse

## 2017-02-14 ENCOUNTER — Encounter: Payer: Self-pay | Admitting: *Deleted

## 2017-02-14 DIAGNOSIS — R7989 Other specified abnormal findings of blood chemistry: Secondary | ICD-10-CM | POA: Diagnosis not present

## 2017-02-14 DIAGNOSIS — K8051 Calculus of bile duct without cholangitis or cholecystitis with obstruction: Secondary | ICD-10-CM

## 2017-02-14 DIAGNOSIS — K859 Acute pancreatitis without necrosis or infection, unspecified: Secondary | ICD-10-CM | POA: Diagnosis not present

## 2017-02-14 DIAGNOSIS — Z886 Allergy status to analgesic agent status: Secondary | ICD-10-CM | POA: Diagnosis not present

## 2017-02-14 DIAGNOSIS — Z8249 Family history of ischemic heart disease and other diseases of the circulatory system: Secondary | ICD-10-CM | POA: Diagnosis not present

## 2017-02-14 DIAGNOSIS — Z885 Allergy status to narcotic agent status: Secondary | ICD-10-CM | POA: Diagnosis not present

## 2017-02-14 DIAGNOSIS — K8050 Calculus of bile duct without cholangitis or cholecystitis without obstruction: Secondary | ICD-10-CM | POA: Diagnosis not present

## 2017-02-14 DIAGNOSIS — K8033 Calculus of bile duct with acute cholangitis with obstruction: Secondary | ICD-10-CM | POA: Diagnosis not present

## 2017-02-14 DIAGNOSIS — K760 Fatty (change of) liver, not elsewhere classified: Secondary | ICD-10-CM | POA: Diagnosis not present

## 2017-02-14 DIAGNOSIS — Z880 Allergy status to penicillin: Secondary | ICD-10-CM | POA: Diagnosis not present

## 2017-02-14 DIAGNOSIS — Z87891 Personal history of nicotine dependence: Secondary | ICD-10-CM | POA: Diagnosis not present

## 2017-02-14 DIAGNOSIS — Z881 Allergy status to other antibiotic agents status: Secondary | ICD-10-CM | POA: Diagnosis not present

## 2017-02-14 DIAGNOSIS — Z882 Allergy status to sulfonamides status: Secondary | ICD-10-CM | POA: Diagnosis not present

## 2017-02-14 DIAGNOSIS — R945 Abnormal results of liver function studies: Secondary | ICD-10-CM

## 2017-02-14 HISTORY — PX: ENDOSCOPIC RETROGRADE CHOLANGIOPANCREATOGRAPHY (ERCP) WITH PROPOFOL: SHX5810

## 2017-02-14 LAB — HEPATIC FUNCTION PANEL
ALBUMIN: 3.8 g/dL (ref 3.5–5.0)
ALK PHOS: 169 U/L — AB (ref 38–126)
ALT: 866 U/L — ABNORMAL HIGH (ref 14–54)
AST: 842 U/L — ABNORMAL HIGH (ref 15–41)
BILIRUBIN INDIRECT: 1.7 mg/dL — AB (ref 0.3–0.9)
Bilirubin, Direct: 1.7 mg/dL — ABNORMAL HIGH (ref 0.1–0.5)
TOTAL PROTEIN: 7.1 g/dL (ref 6.5–8.1)
Total Bilirubin: 3.4 mg/dL — ABNORMAL HIGH (ref 0.3–1.2)

## 2017-02-14 LAB — CBC
HCT: 35 % (ref 35.0–47.0)
Hemoglobin: 12.1 g/dL (ref 12.0–16.0)
MCH: 31.2 pg (ref 26.0–34.0)
MCHC: 34.5 g/dL (ref 32.0–36.0)
MCV: 90.3 fL (ref 80.0–100.0)
Platelets: 226 10*3/uL (ref 150–440)
RBC: 3.88 MIL/uL (ref 3.80–5.20)
RDW: 13.4 % (ref 11.5–14.5)
WBC: 6.2 10*3/uL (ref 3.6–11.0)

## 2017-02-14 SURGERY — ENDOSCOPIC RETROGRADE CHOLANGIOPANCREATOGRAPHY (ERCP) WITH PROPOFOL
Anesthesia: General

## 2017-02-14 MED ORDER — PROPOFOL 500 MG/50ML IV EMUL
INTRAVENOUS | Status: AC
  Filled 2017-02-14: qty 100

## 2017-02-14 MED ORDER — SODIUM CHLORIDE 0.9 % IV SOLN
INTRAVENOUS | Status: DC
  Administered 2017-02-14: 1000 mL via INTRAVENOUS

## 2017-02-14 MED ORDER — MIDAZOLAM HCL 2 MG/2ML IJ SOLN
INTRAMUSCULAR | Status: AC
  Filled 2017-02-14: qty 2

## 2017-02-14 MED ORDER — GLYCOPYRROLATE 0.2 MG/ML IJ SOLN
INTRAMUSCULAR | Status: DC | PRN
  Administered 2017-02-14: 0.2 mg via INTRAVENOUS

## 2017-02-14 MED ORDER — PROPOFOL 500 MG/50ML IV EMUL
INTRAVENOUS | Status: DC | PRN
  Administered 2017-02-14: 120 ug/kg/min via INTRAVENOUS

## 2017-02-14 MED ORDER — MIDAZOLAM HCL 5 MG/5ML IJ SOLN
INTRAMUSCULAR | Status: DC | PRN
  Administered 2017-02-14: 2 mg via INTRAVENOUS

## 2017-02-14 MED ORDER — FENTANYL CITRATE (PF) 100 MCG/2ML IJ SOLN
INTRAMUSCULAR | Status: DC | PRN
  Administered 2017-02-14 (×2): 50 ug via INTRAVENOUS

## 2017-02-14 MED ORDER — LIDOCAINE 2% (20 MG/ML) 5 ML SYRINGE
INTRAMUSCULAR | Status: DC | PRN
  Administered 2017-02-14: 25 mg via INTRAVENOUS

## 2017-02-14 MED ORDER — FENTANYL CITRATE (PF) 100 MCG/2ML IJ SOLN
INTRAMUSCULAR | Status: AC
  Filled 2017-02-14: qty 2

## 2017-02-14 MED ORDER — GADOBENATE DIMEGLUMINE 529 MG/ML IV SOLN
15.0000 mL | Freq: Once | INTRAVENOUS | Status: AC | PRN
  Administered 2017-02-14: 5 mL via INTRAVENOUS

## 2017-02-14 MED ORDER — PROPOFOL 10 MG/ML IV BOLUS
INTRAVENOUS | Status: DC | PRN
  Administered 2017-02-14: 70 mg via INTRAVENOUS

## 2017-02-14 NOTE — Anesthesia Preprocedure Evaluation (Signed)
Anesthesia Evaluation  Patient identified by MRN, date of birth, ID band Patient awake    Reviewed: Allergy & Precautions, NPO status , Patient's Chart, lab work & pertinent test results  History of Anesthesia Complications (+) history of anesthetic complications  Airway Mallampati: II  TM Distance: >3 FB Neck ROM: Full    Dental   Pulmonary neg sleep apnea, neg COPD, former smoker,           Cardiovascular Exercise Tolerance: Good (-) hypertension(-) CAD and (-) Past MI negative cardio ROS   - Systolic murmurs    Neuro/Psych negative neurological ROS  negative psych ROS   GI/Hepatic negative GI ROS, Neg liver ROS, Colon cancer   Endo/Other  negative endocrine ROS  Renal/GU negative Renal ROS  negative genitourinary   Musculoskeletal negative musculoskeletal ROS (+)   Abdominal (+) + obese,   Peds  Hematology  (+) Blood dyscrasia, anemia ,   Anesthesia Other Findings Past Medical History: No date: Anemia No date: Complication of anesthesia     Comment: PT WAS TOLD SHE HAS A SMALL AIRWAY-PT GETS               SEVER SHAKES AFTER GENERAL ANESTHESIA No date: Osteoporosis    Reproductive/Obstetrics negative OB ROS                             Anesthesia Physical  Anesthesia Plan  ASA: II  Anesthesia Plan: General   Post-op Pain Management:    Induction: Intravenous  Airway Management Planned:   Additional Equipment:   Intra-op Plan:   Post-operative Plan:   Informed Consent: I have reviewed the patients History and Physical, chart, labs and discussed the procedure including the risks, benefits and alternatives for the proposed anesthesia with the patient or authorized representative who has indicated his/her understanding and acceptance.   Dental advisory given  Plan Discussed with: CRNA and Anesthesiologist  Anesthesia Plan Comments:         Anesthesia Quick  Evaluation

## 2017-02-14 NOTE — Anesthesia Post-op Follow-up Note (Cosign Needed)
Anesthesia QCDR form completed.        

## 2017-02-14 NOTE — Transfer of Care (Signed)
Immediate Anesthesia Transfer of Care Note  Patient: Diana Rodriguez  Procedure(s) Performed: Procedure(s): ENDOSCOPIC RETROGRADE CHOLANGIOPANCREATOGRAPHY (ERCP) WITH PROPOFOL (N/A)  Patient Location: Endoscopy Unit  Anesthesia Type:General  Level of Consciousness: awake and alert   Airway & Oxygen Therapy: Patient Spontanous Breathing and Patient connected to nasal cannula oxygen  Post-op Assessment: Report given to RN and Post -op Vital signs reviewed and stable  Post vital signs: Reviewed  Last Vitals:  Vitals:   02/14/17 1830 02/14/17 1831  BP: (!) (P) 152/78 (!) 152/78  Pulse: (!) (P) 111 (!) 112  Resp: (P) 18 18  Temp: (P) 37.1 C 37.1 C    Last Pain:  Vitals:   02/14/17 1830  TempSrc: (P) Tympanic  PainSc:       Patients Stated Pain Goal: 0 (24/46/28 6381)  Complications: No apparent anesthesia complications

## 2017-02-14 NOTE — Progress Notes (Signed)
AVSS, Asymptomatic overnight. Follow up LFT's show continued rise in bili and transaminases. Lungs: Clear. Cardio: RR. ABD: Soft. MRCP: Dilated duct w/ o stone, possible stricture. Discussed w/ Dr. Allen Norris from GI. Will review and likely proceed w/ ERCP today.

## 2017-02-14 NOTE — Op Note (Signed)
[facility] Gastroenterology Patient Name: Diana Rodriguez Procedure Date: 02/14/2017 5:44 PM MRN: [id number] Account #: [id number] Date of Birth: [birth year] Admit Type: Inpatient Age: 61 Room: [facility] ENDO ROOM 4 Gender: Female Note Status: Finalized Procedure:            ERCP Indications:          Elevated liver enzymes Providers:            Lucilla Lame MD, MD Referring MD:         Irven Easterly. Kary Kos, MD (Referring MD) Medicines:            Propofol per Anesthesia Complications:        No immediate complications. Procedure:            Pre-Anesthesia Assessment:                       - Prior to the procedure, a History and Physical was                        performed, and patient medications and allergies were                        reviewed. The patient's tolerance of previous                        anesthesia was also reviewed. The risks and benefits of                        the procedure and the sedation options and risks were                        discussed with the patient. All questions were                        answered, and informed consent was obtained. Prior                        Anticoagulants: The patient has taken no previous                        anticoagulant or antiplatelet agents. ASA Grade                        Assessment: II - A patient with mild systemic disease.                        After reviewing the risks and benefits, the patient was                        deemed in satisfactory condition to undergo the                        procedure.                       After obtaining informed consent, the scope was passed                        under direct vision. Throughout the procedure, the  patient's blood pressure, pulse, and oxygen saturations                        were monitored continuously. The ERCP was introduced                        through the mouth, and used to inject contrast into and    used to inject contrast into the bile duct. The ERCP                        was accomplished without difficulty. The patient                        tolerated the procedure well. Findings:      A scout film of the abdomen was obtained. Surgical clips, consistent       with a previous cholecystectomy, were seen in the area of the right       upper quadrant of the abdomen. The esophagus was successfully intubated       under direct vision. The scope was advanced to a normal major papilla in       the descending duodenum without detailed examination of the pharynx,       larynx and associated structures, and upper GI tract. The upper GI tract       was grossly normal. The bile duct was deeply cannulated after a wire was       placed into the PD as a guide. No contrast was injected into the PD.       Contrast was injected into the CBD. I personally interpreted the bile       duct images. There was brisk flow of contrast through the ducts. Image       quality was excellent. Contrast extended to the entire biliary tree. The       lower third of the main bile duct contained a single segmental stenosis.       The lower third of the main bile duct contained one stone mm. The middle       third of the main bile duct and upper third of the main bile duct were       mildly dilated, acquired. A wire was passed into the biliary tree.       Biliary sphincterotomy was made with a sphincterotome using ERBE       electrocautery. There was no post-sphincterotomy bleeding. The biliary       tree was swept with an 18 mm balloon starting at the bifurcation. One       stone was removed. No stones remained. 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                       - The upper third of the main bile duct and middle                        third of the main bile duct were mildly dilated,  acquired.                        - Choledocholithiasis was found. Complete removal was                        accomplished by biliary sphincterotomy and balloon                        extraction.                       - A biliary sphincterotomy was performed.                       - The biliary tree was swept. Recommendation:       - Watch for pancreatitis, bleeding, perforation, and                        cholangitis. Procedure Code(s):    --- Professional ---                       [phone], Endoscopic retrograde cholangiopancreatography                        (ERCP); with removal of calculi/debris from                        biliary/pancreatic duct(s)                       43262, Endoscopic retrograde cholangiopancreatography                        (ERCP); with sphincterotomy/papillotomy                       [id number], Endoscopic catheterization of the biliary ductal                        system, radiological supervision and interpretation Diagnosis Code(s):    --- Professional ---                       R74.8, Abnormal levels of other serum enzymes                       K80.51, Calculus of bile duct without cholangitis or                        cholecystitis with obstruction                       K83.8, Other specified diseases of biliary tract CPT copyright 2016 American [employer] Association. All rights reserved. The codes documented in this report are preliminary and upon coder review may  be revised to meet current compliance requirements. Lucilla Lame MD, MD 02/14/2017 6:34:08 PM This report has been signed electronically. Number of Addenda: 0 Note Initiated On: 02/14/2017 5:44 PM      [facility]

## 2017-02-15 ENCOUNTER — Encounter: Payer: Self-pay | Admitting: Gastroenterology

## 2017-02-15 DIAGNOSIS — K8051 Calculus of bile duct without cholangitis or cholecystitis with obstruction: Secondary | ICD-10-CM | POA: Diagnosis not present

## 2017-02-15 DIAGNOSIS — Z87891 Personal history of nicotine dependence: Secondary | ICD-10-CM | POA: Diagnosis not present

## 2017-02-15 DIAGNOSIS — K859 Acute pancreatitis without necrosis or infection, unspecified: Secondary | ICD-10-CM | POA: Diagnosis not present

## 2017-02-15 DIAGNOSIS — R7989 Other specified abnormal findings of blood chemistry: Secondary | ICD-10-CM | POA: Diagnosis not present

## 2017-02-15 DIAGNOSIS — Z885 Allergy status to narcotic agent status: Secondary | ICD-10-CM | POA: Diagnosis not present

## 2017-02-15 DIAGNOSIS — Z8249 Family history of ischemic heart disease and other diseases of the circulatory system: Secondary | ICD-10-CM | POA: Diagnosis not present

## 2017-02-15 DIAGNOSIS — Z881 Allergy status to other antibiotic agents status: Secondary | ICD-10-CM | POA: Diagnosis not present

## 2017-02-15 DIAGNOSIS — Z886 Allergy status to analgesic agent status: Secondary | ICD-10-CM | POA: Diagnosis not present

## 2017-02-15 DIAGNOSIS — Z882 Allergy status to sulfonamides status: Secondary | ICD-10-CM | POA: Diagnosis not present

## 2017-02-15 DIAGNOSIS — Z880 Allergy status to penicillin: Secondary | ICD-10-CM | POA: Diagnosis not present

## 2017-02-15 LAB — HEPATIC FUNCTION PANEL
ALT: 715 U/L — AB (ref 14–54)
AST: 434 U/L — ABNORMAL HIGH (ref 15–41)
Albumin: 3.9 g/dL (ref 3.5–5.0)
Alkaline Phosphatase: 219 U/L — ABNORMAL HIGH (ref 38–126)
BILIRUBIN INDIRECT: 2.2 mg/dL — AB (ref 0.3–0.9)
Bilirubin, Direct: 3.3 mg/dL — ABNORMAL HIGH (ref 0.1–0.5)
TOTAL PROTEIN: 7.3 g/dL (ref 6.5–8.1)
Total Bilirubin: 5.5 mg/dL — ABNORMAL HIGH (ref 0.3–1.2)

## 2017-02-15 LAB — HEPATITIS PANEL, ACUTE
HEP A IGM: NEGATIVE
HEP B C IGM: NEGATIVE
Hepatitis B Surface Ag: NEGATIVE

## 2017-02-15 LAB — LIPASE, BLOOD: LIPASE: 1752 U/L — AB (ref 11–51)

## 2017-02-15 NOTE — Progress Notes (Signed)
Discharge  Pt was able to participate with discharge teaching with spouse at the bedside. PIV was removed and pt has no pain at this time. Pt declined wheelchair and requested to walk out.

## 2017-02-15 NOTE — Progress Notes (Signed)
Diana Lame, MD [facility]   [address], [place], [place] [address] Phone: [phone] Fax : [phone]   Subjective: The patient is status post ERCP with a stone extraction and sphincterotomy. The patient had some pain in the middle night as needed pain medication but woke up this morning pain-free. The patient's lipase was elevated over 1700. The patient states she is hungry. There is no report of any nausea or vomiting. The patient's liver enzymes went down but the bilirubin went up.   Objective: Vital signs in last 24 hours: Vitals:   02/14/17 1900 02/14/17 1910 02/14/17 2036 02/15/17 0505  BP: (!) 144/70  (!) 146/61 140/62  Pulse: 88 83 69 63  Resp: 17 18 14 16   Temp:   97.5 F (36.4 C) 97.6 F (36.4 C)  TempSrc:   Oral Oral  SpO2: 97% 96% 95% 97%  Weight:      Height:       Weight change:   Intake/Output Summary (Last 24 hours) at 02/15/17 1308 Last data filed at 02/15/17 0502  Gross per 24 hour  Intake              400 ml  Output             1450 ml  Net            -1050 ml     Exam: Heart:: Regular rate and rhythm, S1S2 present or without murmur or extra heart sounds Lungs: normal and clear to auscultation Abdomen: soft, nontender, normal bowel sounds   Lab Results: @LABTEST2 @ Micro Results: No results found for this or any previous visit (from the past 240 hour(s)). Studies/Results: Dg Chest 2 View  Result Date: 02/13/2017 CLINICAL DATA:  61 year old female with history of midsternal chest pain since yesterday. EXAM: CHEST  2 VIEW COMPARISON:  No priors. FINDINGS: Lung volumes are normal. No consolidative airspace disease. No pleural effusions. No pneumothorax. No pulmonary nodule or mass noted. Pulmonary vasculature and the cardiomediastinal silhouette are within normal limits. Aortic atherosclerosis. Lateral view demonstrates old compression fractures of T6 and T9, most severe at T9 where there is approximately 50% loss of anterior vertebral body  height, similar to the prior MRI of the thoracic spine 12/09/2004. Surgical clips project over the right upper quadrant of the abdomen, likely from prior cholecystectomy. IMPRESSION: 1. No radiographic evidence of acute cardiopulmonary disease. 2. Aortic atherosclerosis. 3. Old compression fractures of T6 and T9 appear unchanged. Electronically Signed   By: Vinnie Langton M.D.   On: 02/13/2017 12:56   Ct Abdomen Pelvis W Contrast  Result Date: 02/13/2017 CLINICAL DATA:  Chest pain since 8 p.m. last night. The examination was ordered with a history of abdominal pain and elevated liver function tests. Previous appendectomy and right colectomy approximately 8 weeks ago for colon cancer. History of breast cancer. EXAM: CT ABDOMEN AND PELVIS WITH CONTRAST TECHNIQUE: Multidetector CT imaging of the abdomen and pelvis was performed using the standard protocol following bolus administration of intravenous contrast. CONTRAST:  132mL ISOVUE-300 IOPAMIDOL (ISOVUE-300) INJECTION 61% COMPARISON:  Chest radiographs obtained earlier today. FINDINGS: Lower chest: Small amount of linear density at the left lung base. Minimal bilateral dependent atelectasis. Hepatobiliary: Diffuse low density of the liver relative to the spleen. 9 mm medial segment right lobe liver low density mass with questionable mild peripheral contrast puddling on the delayed images through the kidneys. Cholecystectomy clips. Pancreas: Unremarkable. No pancreatic ductal dilatation or surrounding inflammatory changes. Spleen: Normal in size without  focal abnormality. Adrenals/Urinary Tract: Adrenal glands are unremarkable. Small right renal cyst. Otherwise, the kidneys are normal, without renal calculi, focal lesion, or hydronephrosis. Bladder is unremarkable. Stomach/Bowel: The inferior portion of the right colon is surgically absent. There is wall thickening and adjacent soft tissue stranding involving the remaining inferior portion of the right colon and  small bowel at the anastomosis of the small bowel with the colon. No bowel dilatation. The stomach and small bowel are unremarkable. Vascular/Lymphatic: Mild atheromatous aortic calcifications. Mildly prominent right lower quadrant mesenteric lymph node with a short axis diameter of 6 mm on image number 47. No abnormally enlarged lymph nodes are seen. Reproductive: Surgically absent uterus. Surgical clip in the region of the vaginal cuff on the left. No adnexal masses. Other: Oval area of fat density with surrounding soft tissue stranding in the right mid pelvis, anteriorly. This measures 4.0 x 1.4 cm on coronal image number 30. Musculoskeletal: Lumbar and lower thoracic spine degenerative changes. Approximately 40% T9 vertebral compression deformity without bony retropulsion and no acute fracture lines. IMPRESSION: 1. Status post partial right colectomy with wall thickening and inflammatory changes of the anastomosis, involving the colon and distal small bowel. Residual or recurrent malignancy could have this appearance. Infectious or inflammatory changes could also have this appearance. 2. 4.0 cm area of probable fat necrosis in the right pelvis. 3. 9 mm right lobe liver mass. This most likely represents a cyst or hemangioma. A solitary metastasis is less likely but not excluded. 4. Diffuse hepatic steatosis. 5. Probable minimally reactive right lower quadrant mesenteric lymph node. An early metastatic node is not excluded. Electronically Signed   By: Claudie Revering M.D.   On: 02/13/2017 16:02   Mr 3d Recon At Scanner  Result Date: 02/14/2017 CLINICAL DATA:  Biliary colic EXAM: MRI ABDOMEN WITHOUT AND WITH CONTRAST (INCLUDING MRCP) TECHNIQUE: Multiplanar multisequence MR imaging of the abdomen was performed both before and after the administration of intravenous contrast. Heavily T2-weighted images of the biliary and pancreatic ducts were obtained, and three-dimensional MRCP images were rendered by post  processing. CONTRAST:  72mL MULTIHANCE GADOBENATE DIMEGLUMINE 529 MG/ML IV SOLN COMPARISON:  None. FINDINGS: Lower chest: No acute findings. Hepatobiliary: There is marked diffuse hepatic steatosis. Cyst within segment 4 measures 8 mm. On the postcontrast images exam detail is significantly diminished secondary to motion artifact. Within this limitation, no enhancing liver abnormality identified. Moderate intrahepatic bile duct dilatation. The common bile duct is increased in caliber measuring up to 1.3 cm. No choledocholithiasis identified. Short segment of narrowing within the distal common bile duct measures approximately 6 mm in may reflect underline stricture. The pancreatic duct has a normal caliber. Pancreas: Within the limitations of motion artifact no suspicious pancreas lesions identified. Small head of pancreas mass or ampullary lesion cannot be excluded. No inflammatory changes identified. Spleen:  Within normal limits in size and appearance. Adrenals/Urinary Tract: No masses identified. No evidence of hydronephrosis. Stomach/Bowel: Visualized portions within the abdomen are unremarkable. Vascular/Lymphatic: No pathologically enlarged lymph nodes identified. No abdominal aortic aneurysm demonstrated. Other:  No free fluid or fluid collections. Musculoskeletal: No suspicious bone lesions identified. IMPRESSION: 1. Abnormal increase caliber of the common bile duct with moderate intrahepatic bile duct dilatation. A distal common bile duct stricture is suspected. No stone or mass noted. A small head of pancreas mass or ampullary lesion cannot be excluded. Consider further investigation with ERCP. 2. Hepatic steatosis. Electronically Signed   By: Kerby Moors M.D.   On: 02/14/2017 10:13  Mr Abdomen Mrcp W Wo Contast  Result Date: 02/14/2017 CLINICAL DATA:  Biliary colic EXAM: MRI ABDOMEN WITHOUT AND WITH CONTRAST (INCLUDING MRCP) TECHNIQUE: Multiplanar multisequence MR imaging of the abdomen was  performed both before and after the administration of intravenous contrast. Heavily T2-weighted images of the biliary and pancreatic ducts were obtained, and three-dimensional MRCP images were rendered by post processing. CONTRAST:  75mL MULTIHANCE GADOBENATE DIMEGLUMINE 529 MG/ML IV SOLN COMPARISON:  None. FINDINGS: Lower chest: No acute findings. Hepatobiliary: There is marked diffuse hepatic steatosis. Cyst within segment 4 measures 8 mm. On the postcontrast images exam detail is significantly diminished secondary to motion artifact. Within this limitation, no enhancing liver abnormality identified. Moderate intrahepatic bile duct dilatation. The common bile duct is increased in caliber measuring up to 1.3 cm. No choledocholithiasis identified. Short segment of narrowing within the distal common bile duct measures approximately 6 mm in may reflect underline stricture. The pancreatic duct has a normal caliber. Pancreas: Within the limitations of motion artifact no suspicious pancreas lesions identified. Small head of pancreas mass or ampullary lesion cannot be excluded. No inflammatory changes identified. Spleen:  Within normal limits in size and appearance. Adrenals/Urinary Tract: No masses identified. No evidence of hydronephrosis. Stomach/Bowel: Visualized portions within the abdomen are unremarkable. Vascular/Lymphatic: No pathologically enlarged lymph nodes identified. No abdominal aortic aneurysm demonstrated. Other:  No free fluid or fluid collections. Musculoskeletal: No suspicious bone lesions identified. IMPRESSION: 1. Abnormal increase caliber of the common bile duct with moderate intrahepatic bile duct dilatation. A distal common bile duct stricture is suspected. No stone or mass noted. A small head of pancreas mass or ampullary lesion cannot be excluded. Consider further investigation with ERCP. 2. Hepatic steatosis. Electronically Signed   By: Kerby Moors M.D.   On: 02/14/2017 10:13   US Abdomen  Limited Ruq  Result Date: 02/13/2017 CLINICAL DATA:  61 year old female bold with history of abdominal pain for 1 day. EXAM: US ABDOMEN LIMITED - RIGHT UPPER QUADRANT COMPARISON:  No priors. FINDINGS: Gallbladder: Status post cholecystectomy. Common bile duct: Diameter: 9.5 mm Liver: No focal lesion identified. Mild diffuse increased echogenicity throughout the hepatic parenchyma, suggesting a background of hepatic steatosis. IMPRESSION: 1. No acute findings. 2. Probable mild hepatic steatosis. 3. Status post cholecystectomy. Electronically Signed   By: Vinnie Langton M.D.   On: 02/13/2017 14:22   Medications: I have reviewed the patient's current medications. Scheduled Meds: . sodium chloride flush  3 mL Intravenous Q12H  . traMADol  100 mg Oral Q6H   Continuous Infusions: PRN Meds:.sodium chloride, acetaminophen **OR** acetaminophen, ondansetron **OR** ondansetron (ZOFRAN) IV, promethazine, sodium chloride flush   Assessment: Active Problems:   Biliary colic   Elevated LFTs   Calculus of bile duct without cholecystitis with obstruction    Plan: The patient has chemical pancreatic times without any abdominal pain at this time. The patient can have her diet advanced and send home if she tolerates her breakfast. The patient should have her liver enzymes checked in the next few days to make sure the liver enzymes continue to come down. The patient has been explained the plan and agrees with it.   LOS: 0 days   Diana Rodriguez 02/15/2017, 8:23 AM

## 2017-02-15 NOTE — Progress Notes (Signed)
AVSS. Transaminases trending down, alk phos and Bili up. Lipase 1700+. Mild pain 3 AM, none since. No nausea. Chest discomfort w/ deep inspiration resolved. Lungs: Clear. ABD: Soft. Case reviewed w/ Dr. Allen Norris. Will advance diet to low fat. If tolerated, home after lunch. Will recheck labs in 2 days if she toletates diet.

## 2017-02-17 ENCOUNTER — Ambulatory Visit (INDEPENDENT_AMBULATORY_CARE_PROVIDER_SITE_OTHER): Payer: 59 | Admitting: General Surgery

## 2017-02-17 ENCOUNTER — Encounter: Payer: Self-pay | Admitting: General Surgery

## 2017-02-17 VITALS — BP 124/80 | HR 81 | Resp 12 | Ht 60.0 in | Wt 173.0 lb

## 2017-02-17 DIAGNOSIS — Z1371 Encounter for nonprocreative screening for genetic disease carrier status: Secondary | ICD-10-CM

## 2017-02-17 DIAGNOSIS — K8051 Calculus of bile duct without cholangitis or cholecystitis with obstruction: Secondary | ICD-10-CM

## 2017-02-17 DIAGNOSIS — D122 Benign neoplasm of ascending colon: Secondary | ICD-10-CM | POA: Diagnosis not present

## 2017-02-17 DIAGNOSIS — K8000 Calculus of gallbladder with acute cholecystitis without obstruction: Secondary | ICD-10-CM | POA: Diagnosis not present

## 2017-02-17 HISTORY — DX: Encounter for nonprocreative screening for genetic disease carrier status: Z13.71

## 2017-02-17 LAB — HEPATIC FUNCTION PANEL
ALK PHOS: 208 IU/L — AB (ref 39–117)
ALT: 331 IU/L — ABNORMAL HIGH (ref 0–32)
AST: 71 IU/L — AB (ref 0–40)
Albumin: 4.5 g/dL (ref 3.6–4.8)
BILIRUBIN TOTAL: 0.8 mg/dL (ref 0.0–1.2)
BILIRUBIN, DIRECT: 0.38 mg/dL (ref 0.00–0.40)
Total Protein: 7.6 g/dL (ref 6.0–8.5)

## 2017-02-17 LAB — CBC WITH DIFFERENTIAL/PLATELET
BASOS: 1 %
Basophils Absolute: 0.1 10*3/uL (ref 0.0–0.2)
EOS (ABSOLUTE): 0.6 10*3/uL — ABNORMAL HIGH (ref 0.0–0.4)
EOS: 7 %
HEMATOCRIT: 39.1 % (ref 34.0–46.6)
HEMOGLOBIN: 13.1 g/dL (ref 11.1–15.9)
IMMATURE GRANULOCYTES: 0 %
Immature Grans (Abs): 0 10*3/uL (ref 0.0–0.1)
Lymphocytes Absolute: 1.6 10*3/uL (ref 0.7–3.1)
Lymphs: 20 %
MCH: 30.9 pg (ref 26.6–33.0)
MCHC: 33.5 g/dL (ref 31.5–35.7)
MCV: 92 fL (ref 79–97)
MONOS ABS: 0.6 10*3/uL (ref 0.1–0.9)
Monocytes: 7 %
Neutrophils Absolute: 5.2 10*3/uL (ref 1.4–7.0)
Neutrophils: 65 %
Platelets: 291 10*3/uL (ref 150–379)
RBC: 4.24 x10E6/uL (ref 3.77–5.28)
RDW: 13.8 % (ref 12.3–15.4)
WBC: 8 10*3/uL (ref 3.4–10.8)

## 2017-02-17 LAB — LIPASE: Lipase: 247 U/L — ABNORMAL HIGH (ref 14–72)

## 2017-02-17 NOTE — Progress Notes (Signed)
Patient ID: Diana Rodriguez, female   DOB: Jun 01, 1956, 61 y.o.   MRN: [id number]  Chief Complaint  Patient presents with  . Follow-up    HPI Diana Rodriguez is a 61 y.o. female here today following up from her hospital stay on 02/13/2017 from a common bile stomach stone removed by ERCP. Postoperatively she did have elevation of her serum lipase and transient elevation in the bilirubin and transaminases.. Patient states yesterday she was not feeling well, she said it was hard to breath.This has all resolved and she is feeling well today. No problems moving her bowels.   The patient denies fever or chills.                                                                                Marland KitchenHPI  Past Medical History:  Diagnosis Date  . Anemia   . Colon polyps   . Complication of anesthesia    PT WAS TOLD SHE HAS A SMALL AIRWAY-PT GETS SEVER SHAKES AFTER GENERAL ANESTHESIA  . Osteoporosis     Past Surgical History:  Procedure Laterality Date  . ABDOMINAL HYSTERECTOMY    . APPENDECTOMY    . CHOLECYSTECTOMY    . COLONOSCOPY WITH PROPOFOL N/A 08/23/2016   Procedure: COLONOSCOPY WITH PROPOFOL;  Surgeon: Lollie Sails, MD;  Location: [facility] ENDOSCOPY;  Service: Endoscopy;  Laterality: N/A;  . DIAGNOSTIC LAPAROSCOPY    . ENDOSCOPIC RETROGRADE CHOLANGIOPANCREATOGRAPHY (ERCP) WITH PROPOFOL N/A 02/14/2017   Procedure: ENDOSCOPIC RETROGRADE CHOLANGIOPANCREATOGRAPHY (ERCP) WITH PROPOFOL;  Surgeon: Lucilla Lame, MD;  Location: [facility] ENDOSCOPY;  Service: Endoscopy;  Laterality: N/A;  . LAPAROSCOPIC OOPHERECTOMY    . LAPAROSCOPIC RIGHT COLECTOMY Right 12/20/2016   Procedure: LAPAROSCOPIC RIGHT COLECTOMY;  Surgeon: Christene Lye, MD;  Location: [facility] ORS;  Service: General;  Laterality: Right;  . UMBILICAL HERNIA REPAIR  12/20/2016   Procedure: HERNIA REPAIR UMBILICAL ADULT;  Surgeon: Christene Lye, MD;  Location: [facility] ORS;  Service: General;;    Family History  Problem Relation Age of Onset   . Cancer Mother 100    lung  . Cancer Father 57    lung  . Cancer Paternal Aunt 22    breast and colon cancer  . Ovarian cancer Paternal Aunt 58    ovarian and colon  . Breast cancer Paternal Aunt 45    Social History Social History  Substance Use Topics  . Smoking status: Former Smoker    Packs/day: 0.50    Years: 15.00    Types: Cigarettes    Quit date: 11/29/1990  . Smokeless tobacco: Never Used  . Alcohol use 0.0 oz/week     Comment: OCC    Allergies  Allergen Reactions  . Erythromycin Anaphylaxis  . Advil [Ibuprofen] Swelling  . Aspirin Other (See Comments)    Pt reports her father was allergic to aspirin and concerned she may be allergic also  . Cephalexin Hives  . Doxycycline Hives  . Penicillins Hives    Has patient had a PCN reaction causing immediate rash, facial/tongue/throat swelling, SOB or lightheadedness with hypotension:unsure Has patient had a PCN reaction causing severe rash involving mucus membranes or skin necrosis:Yes Has patient had a PCN  reaction that required hospitalization:No Has patient had a PCN reaction occurring within the last 10 years:No If all of the above answers are "NO", then may proceed with Cephalosporin use.   . Septra [Sulfamethoxazole-Trimethoprim] Hives  . Vicodin [Hydrocodone-Acetaminophen] Hives    No current outpatient prescriptions on file.   No current facility-administered medications for this visit.     Review of Systems Review of Systems  Constitutional: Negative.   Respiratory: Negative.   Cardiovascular: Negative.     Blood pressure 124/80, pulse 81, resp. rate 12, height 5' (1.524 m), weight 173 lb (78.5 kg).  Physical Exam Physical Exam  Constitutional: She appears well-developed and well-nourished.  Eyes: Scleral icterus is present.  Neck: Neck supple. No thyromegaly present.  Cardiovascular: Normal rate and regular rhythm.   Pulmonary/Chest: Effort normal and breath sounds normal.  Abdominal: Soft.  Bowel sounds are normal. There is no tenderness.    Data Reviewed Laboratory studies obtained today showed lipase improved at 247. CBC today is normal with a hemoglobin of 13.1 with a white blood cell count of 8000. Liver function studies markedly improved: Bilirubin 0.8 with a direct component of 0.38, transaminases markedly improved.   Assessment    Choledocholithiasis status post ERCP and stone extraction, resolution of transient pancreatitis.    Plan    The patient will continue on a low-fat diet for the next week. Activity as tolerated.    Patient to return in one week with Dr. Jamal Collin. This information has been scribed by Gaspar Cola CMA.   Robert Bellow 02/17/2017, 2:22 PM

## 2017-02-17 NOTE — Patient Instructions (Signed)
Patient to return in one week with Dr. Jamal Collin.

## 2017-02-17 NOTE — Anesthesia Postprocedure Evaluation (Signed)
Anesthesia Post Note  Patient: Diana Rodriguez  Procedure(s) Performed: Procedure(s) (LRB): ENDOSCOPIC RETROGRADE CHOLANGIOPANCREATOGRAPHY (ERCP) WITH PROPOFOL (N/A)  Patient location during evaluation: Endoscopy Anesthesia Type: General Level of consciousness: awake and alert Pain management: pain level controlled Vital Signs Assessment: post-procedure vital signs reviewed and stable Respiratory status: spontaneous breathing, nonlabored ventilation, respiratory function stable and patient connected to nasal cannula oxygen Cardiovascular status: blood pressure returned to baseline and stable Postop Assessment: no signs of nausea or vomiting Anesthetic complications: no     Last Vitals:  Vitals:   02/15/17 0505 02/15/17 1242  BP: 140/62 138/60  Pulse: 63 64  Resp: 16 16  Temp: 36.4 C 36.6 C    Last Pain:  Vitals:   02/15/17 1242  TempSrc: Oral  PainSc:                  [name]

## 2017-02-21 NOTE — Discharge Summary (Signed)
Physician Discharge Summary  Patient ID: Diana Rodriguez MRN: [id number] DOB/AGE: 61-Mar-1957 61 y.o.  Admit date: 02/13/2017 Discharge date: 02/21/2017  Admission Diagnoses: Choledocholithiasis  Discharge Diagnoses:  Active Problems:   Biliary colic   Elevated LFTs   Calculus of bile duct without cholecystitis with obstruction Post procedure pancreatitis  Discharged Condition: good  Hospital Course: Admitted with clinical impression of common bile duct stone. ERCP confirmed. Post procedure pancreatitis. (chemical).  Consults: GI  Significant Diagnostic Studies: endoscopy: ERCP: Wohl  Treatments: IV hydration  Discharge Exam: Blood pressure 138/60, pulse 64, temperature 97.9 F (36.6 C), temperature source Oral, resp. rate 16, height 5\' 4"  (1.626 m), weight 177 lb (80.3 kg), SpO2 98 %. General appearance: alert Resp: clear to auscultation bilaterally Cardio: regular rate and rhythm, S1, S2 normal, no murmur, click, rub or gallop GI: soft, non-tender; bowel sounds normal; no masses,  no organomegaly  Disposition: 01-Home or Self Care  Discharge Instructions    Diet Heart    Complete by:  As directed    Discharge instructions    Complete by:  As directed    Call if pain recurs, nausea, or vomiting.   Increase activity slowly    Complete by:  As directed      Allergies as of 02/15/2017      Reactions   Erythromycin Anaphylaxis   Advil [ibuprofen] Swelling   Aspirin Other (See Comments)   Pt reports her father was allergic to aspirin and concerned she may be allergic also   Cephalexin Hives   Doxycycline Hives   Penicillins Hives   Has patient had a PCN reaction causing immediate rash, facial/tongue/throat swelling, SOB or lightheadedness with hypotension:unsure Has patient had a PCN reaction causing severe rash involving mucus membranes or skin necrosis:Yes Has patient had a PCN reaction that required hospitalization:No Has patient had a PCN reaction occurring within  the last 10 years:No If all of the above answers are "NO", then may proceed with Cephalosporin use.   Septra [sulfamethoxazole-trimethoprim] Hives   Vicodin [hydrocodone-acetaminophen] Hives      Medication List    You have not been prescribed any medications.    Follow-up Information    Byrnett, Forest Gleason, MD. Go on 02/17/2017.   Specialties:  General Surgery, Radiology Why:  Thursday at 10:15am for hospital follow-up Contact information: [address] [place] [address] [phone]           Signed: Robert Bellow 02/21/2017, 8:35 PM

## 2017-02-24 ENCOUNTER — Ambulatory Visit: Payer: 59 | Admitting: General Surgery

## 2017-03-02 ENCOUNTER — Encounter: Payer: Self-pay | Admitting: General Surgery

## 2017-03-02 ENCOUNTER — Ambulatory Visit (INDEPENDENT_AMBULATORY_CARE_PROVIDER_SITE_OTHER): Payer: 59 | Admitting: General Surgery

## 2017-03-02 VITALS — BP 132/78 | HR 69 | Resp 12 | Ht 60.0 in | Wt 177.0 lb

## 2017-03-02 DIAGNOSIS — K8050 Calculus of bile duct without cholangitis or cholecystitis without obstruction: Secondary | ICD-10-CM

## 2017-03-02 NOTE — Progress Notes (Signed)
Patient ID: Diana Rodriguez, female   DOB: [birth year], 61 y.o.   MRN: [id number]  Chief Complaint  Patient presents with  . Follow-up    HPI Diana Rodriguez is a 61 y.o. female here today following up from her hospital stay on 02/13/2017 from a common bile stomach stone removed by ERCP. Patient states she is doing well. No new complaints. I have reviewed the history of present illness with the patient.  HPI  Past Medical History:  Diagnosis Date  . Anemia   . Colon polyps   . Complication of anesthesia    PT WAS TOLD SHE HAS A SMALL AIRWAY-PT GETS SEVER SHAKES AFTER GENERAL ANESTHESIA  . Osteoporosis     Past Surgical History:  Procedure Laterality Date  . ABDOMINAL HYSTERECTOMY    . APPENDECTOMY    . CHOLECYSTECTOMY    . COLONOSCOPY WITH PROPOFOL N/A 08/23/2016   Procedure: COLONOSCOPY WITH PROPOFOL;  Surgeon: Lollie Sails, MD;  Location: [facility] ENDOSCOPY;  Service: Endoscopy;  Laterality: N/A;  . DIAGNOSTIC LAPAROSCOPY    . ENDOSCOPIC RETROGRADE CHOLANGIOPANCREATOGRAPHY (ERCP) WITH PROPOFOL N/A 02/14/2017   Procedure: ENDOSCOPIC RETROGRADE CHOLANGIOPANCREATOGRAPHY (ERCP) WITH PROPOFOL;  Surgeon: Lucilla Lame, MD;  Location: [facility] ENDOSCOPY;  Service: Endoscopy;  Laterality: N/A;  . LAPAROSCOPIC OOPHERECTOMY    . LAPAROSCOPIC RIGHT COLECTOMY Right 12/20/2016   Procedure: LAPAROSCOPIC RIGHT COLECTOMY;  Surgeon: Christene Lye, MD;  Location: [facility] ORS;  Service: General;  Laterality: Right;  . UMBILICAL HERNIA REPAIR  12/20/2016   Procedure: HERNIA REPAIR UMBILICAL ADULT;  Surgeon: Christene Lye, MD;  Location: [facility] ORS;  Service: General;;    Family History  Problem Relation Age of Onset  . Cancer Mother 76    lung  . Cancer Father 66    lung  . Cancer Paternal Aunt 30    breast and colon cancer  . Ovarian cancer Paternal Aunt 57    ovarian and colon  . Breast cancer Paternal Aunt 58    Social History Social History  Substance Use Topics  . Smoking status:  Former Smoker    Packs/day: 0.50    Years: 15.00    Types: Cigarettes    Quit date: 11/29/1990  . Smokeless tobacco: Never Used  . Alcohol use 0.0 oz/week     Comment: OCC    Allergies  Allergen Reactions  . Erythromycin Anaphylaxis  . Advil [Ibuprofen] Swelling  . Aspirin Other (See Comments)    Pt reports her father was allergic to aspirin and concerned she may be allergic also  . Cephalexin Hives  . Doxycycline Hives  . Penicillins Hives    Has patient had a PCN reaction causing immediate rash, facial/tongue/throat swelling, SOB or lightheadedness with hypotension:unsure Has patient had a PCN reaction causing severe rash involving mucus membranes or skin necrosis:Yes Has patient had a PCN reaction that required hospitalization:No Has patient had a PCN reaction occurring within the last 10 years:No If all of the above answers are "NO", then may proceed with Cephalosporin use.   . Septra [Sulfamethoxazole-Trimethoprim] Hives  . Vicodin [Hydrocodone-Acetaminophen] Hives    No current outpatient prescriptions on file.   No current facility-administered medications for this visit.     Review of Systems Review of Systems  Constitutional: Negative.   Respiratory: Negative.   Cardiovascular: Negative.   Gastrointestinal: Negative.     Blood pressure 132/78, pulse 69, resp. rate 12, height 5' (1.524 m), weight 177 lb (80.3 kg).  Physical Exam Physical Exam  Constitutional:  She is oriented to person, place, and time. She appears well-developed and well-nourished.  Eyes: Conjunctivae are normal. No scleral icterus.  Cardiovascular: Normal rate, regular rhythm and normal heart sounds.   Pulmonary/Chest: Effort normal and breath sounds normal.  Abdominal: Soft. Normal appearance and bowel sounds are normal. There is no hepatomegaly. There is no tenderness.  Neurological: She is alert and oriented to person, place, and time.  Skin: Skin is warm and dry.    Data  Reviewed Prior notes and labs reviewed  Assessment    Stable exam. Post ERCP for removal of CBD stone, doing well.    Plan    Patient to return as scheduled     This information has been scribed by Gaspar Cola CMA.   SANKAR,SEEPLAPUTHUR G 03/02/2017, 3:40 PM

## 2017-03-02 NOTE — Patient Instructions (Signed)
Patient to return as scheduled.  

## 2017-03-09 ENCOUNTER — Ambulatory Visit: Payer: Self-pay

## 2017-03-11 DIAGNOSIS — H5213 Myopia, bilateral: Secondary | ICD-10-CM | POA: Diagnosis not present

## 2017-03-24 ENCOUNTER — Telehealth: Payer: Self-pay | Admitting: *Deleted

## 2017-03-24 ENCOUNTER — Encounter: Payer: Self-pay | Admitting: *Deleted

## 2017-03-24 NOTE — Telephone Encounter (Signed)
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 There is genetic counseling offered via phone with [facility] [phone] that if free if she desires.

## 2017-03-25 ENCOUNTER — Other Ambulatory Visit: Payer: Self-pay | Admitting: Family Medicine

## 2017-03-25 DIAGNOSIS — Z1231 Encounter for screening mammogram for malignant neoplasm of breast: Secondary | ICD-10-CM

## 2017-03-25 NOTE — Telephone Encounter (Signed)
Notified patient as instructed, patient pleased. Discussed follow-up appointments, patient agrees  

## 2017-04-01 ENCOUNTER — Ambulatory Visit
Admission: RE | Admit: 2017-04-01 | Discharge: 2017-04-01 | Disposition: A | Discharge status: Home / Self Care | Payer: 59 | Source: Ambulatory Visit | Attending: Family Medicine | Admitting: Family Medicine

## 2017-04-01 DIAGNOSIS — Z1231 Encounter for screening mammogram for malignant neoplasm of breast: Secondary | ICD-10-CM | POA: Diagnosis not present

## 2017-07-06 ENCOUNTER — Ambulatory Visit (INDEPENDENT_AMBULATORY_CARE_PROVIDER_SITE_OTHER): Payer: 59 | Admitting: General Surgery

## 2017-07-06 ENCOUNTER — Ambulatory Visit: Payer: 59 | Admitting: General Surgery

## 2017-07-06 VITALS — BP 144/78 | HR 72 | Resp 12 | Ht 60.0 in | Wt 179.0 lb

## 2017-07-06 DIAGNOSIS — Z860100 Personal history of colon polyps, unspecified: Secondary | ICD-10-CM

## 2017-07-07 ENCOUNTER — Telehealth: Payer: Self-pay | Admitting: *Deleted

## 2017-07-07 ENCOUNTER — Encounter: Payer: Self-pay | Admitting: General Surgery

## 2017-07-07 NOTE — Progress Notes (Signed)
Patient ID: Diana Rodriguez, female   DOB: 04/24/1956, 61 y.o.   MRN: [id number]  Chief Complaint  Patient presents with  . Follow-up    HPI Diana Rodriguez is a 61 y.o. female here today for her follow from a colonoscopy and right colectomy. Patient states she has been having diarrhea since Saturday. She thinks it is a bug or something she ate. Today she has had no diarrhea. No other complaints. She is otherwise doing very well HPI  Past Medical History:  Diagnosis Date  . Anemia   . BRCA negative 02/17/2017   variant of uncertain significant  . Colon polyps   . Complication of anesthesia    PT WAS TOLD SHE HAS A SMALL AIRWAY-PT GETS SEVER SHAKES AFTER GENERAL ANESTHESIA  . Osteoporosis     Past Surgical History:  Procedure Laterality Date  . ABDOMINAL HYSTERECTOMY    . APPENDECTOMY    . CHOLECYSTECTOMY    . COLONOSCOPY WITH PROPOFOL N/A 08/23/2016   Procedure: COLONOSCOPY WITH PROPOFOL;  Surgeon: Lollie Sails, MD;  Location: [facility] ENDOSCOPY;  Service: Endoscopy;  Laterality: N/A;  . DIAGNOSTIC LAPAROSCOPY    . ENDOSCOPIC RETROGRADE CHOLANGIOPANCREATOGRAPHY (ERCP) WITH PROPOFOL N/A 02/14/2017   Procedure: ENDOSCOPIC RETROGRADE CHOLANGIOPANCREATOGRAPHY (ERCP) WITH PROPOFOL;  Surgeon: Lucilla Lame, MD;  Location: [facility] ENDOSCOPY;  Service: Endoscopy;  Laterality: N/A;  . LAPAROSCOPIC OOPHERECTOMY    . LAPAROSCOPIC RIGHT COLECTOMY Right 12/20/2016   Procedure: LAPAROSCOPIC RIGHT COLECTOMY;  Surgeon: Christene Lye, MD;  Location: [facility] ORS;  Service: General;  Laterality: Right;  . UMBILICAL HERNIA REPAIR  12/20/2016   Procedure: HERNIA REPAIR UMBILICAL ADULT;  Surgeon: Christene Lye, MD;  Location: [facility] ORS;  Service: General;;    Family History  Problem Relation Age of Onset  . Cancer Mother 20       lung  . Cancer Father 39       lung  . Cancer Paternal Aunt 82       breast and colon cancer  . Ovarian cancer Paternal Aunt 56       ovarian and colon  . Breast  cancer Paternal Aunt 31    Social History Social History  Substance Use Topics  . Smoking status: Former Smoker    Packs/day: 0.50    Years: 15.00    Types: Cigarettes    Quit date: 11/29/1990  . Smokeless tobacco: Never Used  . Alcohol use 0.0 oz/week     Comment: OCC    Allergies  Allergen Reactions  . Erythromycin Anaphylaxis  . Advil [Ibuprofen] Swelling  . Aspirin Other (See Comments)    Pt reports her father was allergic to aspirin and concerned she may be allergic also  . Cephalexin Hives  . Doxycycline Hives  . Penicillins Hives    Has patient had a PCN reaction causing immediate rash, facial/tongue/throat swelling, SOB or lightheadedness with hypotension:unsure Has patient had a PCN reaction causing severe rash involving mucus membranes or skin necrosis:Yes Has patient had a PCN reaction that required hospitalization:No Has patient had a PCN reaction occurring within the last 10 years:No If all of the above answers are "NO", then may proceed with Cephalosporin use.   . Septra [Sulfamethoxazole-Trimethoprim] Hives  . Vicodin [Hydrocodone-Acetaminophen] Hives    No current outpatient prescriptions on file.   No current facility-administered medications for this visit.     Review of Systems Review of Systems  Constitutional: Negative.   Respiratory: Negative.   Cardiovascular: Negative.  Blood pressure (!) 144/78, pulse 72, resp. rate 12, height 5' (1.524 m), weight 179 lb (81.2 kg).  Physical Exam Physical Exam  Constitutional: She is oriented to person, place, and time. She appears well-developed and well-nourished.  Eyes: Conjunctivae are normal. No scleral icterus.  Neck: Neck supple.  Cardiovascular: Normal rate, regular rhythm and normal heart sounds.   Pulmonary/Chest: Effort normal and breath sounds normal.  Abdominal: Soft. Bowel sounds are normal. There is no hepatomegaly. There is no tenderness. No hernia.  Lymphadenopathy:    She has no  cervical adenopathy.  Neurological: She is alert and oriented to person, place, and time.  Skin: Skin is warm and dry.    Data Reviewed Prior notes reviewed   Assessment   Hx of colon polyps - pt is doing well 7 months post-op hemicolectomy for adenomatous polyp of the ascending colon. Will schedule   colonoscopy in October.      Plan    Patient to return in October,2018 for colonoscopy. The patient is aware to call back for any questions or concerns.  HPI, Physical Exam, Assessment and Plan have been scribed under the direction and in the presence of Mckinley Jewel, MD  Gaspar Cola, CMA    I have completed the exam and reviewed the above documentation for accuracy and completeness.  I agree with the above.  Haematologist has been used and any errors in dictation or transcription are unintentional.  Swade Shonka G. Jamal Collin, M.D., F.A.C.S.  Junie Panning Darnell Level 07/07/2017, 2:03 PM  Some documentation for today's visit was delayed due to system wide EMR failure on 07-06-17 from 2:30 to 5:30 pm.

## 2017-07-07 NOTE — Patient Instructions (Signed)
Patient to return in October,2018 for colonoscopy. The patient is aware to call back for any questions or concerns.

## 2017-07-07 NOTE — Telephone Encounter (Signed)
I have left a message with [facility]) to see what if anything needs to be done

## 2017-07-13 ENCOUNTER — Encounter: Payer: Self-pay | Admitting: *Deleted

## 2017-07-13 NOTE — Telephone Encounter (Signed)
I was contacted by [employer] and insurance has paid for testing. My Chart Message sent to pt.

## 2017-07-27 ENCOUNTER — Encounter: Payer: Self-pay | Admitting: *Deleted

## 2017-08-10 ENCOUNTER — Telehealth: Payer: Self-pay | Admitting: *Deleted

## 2017-08-10 ENCOUNTER — Other Ambulatory Visit: Payer: Self-pay | Admitting: *Deleted

## 2017-08-10 MED ORDER — POLYETHYLENE GLYCOL 3350 17 GM/SCOOP PO POWD: ORAL | 0 refills | Status: DC

## 2017-08-10 NOTE — Telephone Encounter (Signed)
Patient has been scheduled for a colonoscopy on 10-26-17 at [facility]. Miralax prescription has been sent in to the patient's pharmacy today. Colonoscopy instructions have been mailed to the patient. This patient is aware to call the office if they have further questions.

## 2017-10-19 ENCOUNTER — Other Ambulatory Visit: Payer: Self-pay | Admitting: General Surgery

## 2017-10-19 DIAGNOSIS — Z86010 Personal history of colonic polyps: Secondary | ICD-10-CM

## 2017-10-26 ENCOUNTER — Ambulatory Visit: Payer: Commercial Managed Care - PPO | Admitting: Anesthesiology

## 2017-10-26 ENCOUNTER — Ambulatory Visit
Admission: RE | Admit: 2017-10-26 | Discharge: 2017-10-26 | Disposition: A | Discharge status: Home / Self Care | Payer: Commercial Managed Care - PPO | Source: Ambulatory Visit | Attending: General Surgery | Admitting: General Surgery

## 2017-10-26 ENCOUNTER — Encounter
Admission: RE | Disposition: A | Discharge status: Home / Self Care | Payer: Self-pay | Source: Ambulatory Visit | Attending: General Surgery

## 2017-10-26 DIAGNOSIS — M810 Age-related osteoporosis without current pathological fracture: Secondary | ICD-10-CM | POA: Insufficient documentation

## 2017-10-26 DIAGNOSIS — Z1211 Encounter for screening for malignant neoplasm of colon: Secondary | ICD-10-CM | POA: Insufficient documentation

## 2017-10-26 DIAGNOSIS — Z880 Allergy status to penicillin: Secondary | ICD-10-CM | POA: Insufficient documentation

## 2017-10-26 DIAGNOSIS — Z888 Allergy status to other drugs, medicaments and biological substances status: Secondary | ICD-10-CM | POA: Diagnosis not present

## 2017-10-26 DIAGNOSIS — Z980 Intestinal bypass and anastomosis status: Secondary | ICD-10-CM | POA: Diagnosis not present

## 2017-10-26 DIAGNOSIS — Z86010 Personal history of colonic polyps: Secondary | ICD-10-CM | POA: Insufficient documentation

## 2017-10-26 DIAGNOSIS — Z90710 Acquired absence of both cervix and uterus: Secondary | ICD-10-CM | POA: Diagnosis not present

## 2017-10-26 DIAGNOSIS — Z800 Family history of malignant neoplasm of digestive organs: Secondary | ICD-10-CM | POA: Diagnosis not present

## 2017-10-26 DIAGNOSIS — Z881 Allergy status to other antibiotic agents status: Secondary | ICD-10-CM | POA: Insufficient documentation

## 2017-10-26 DIAGNOSIS — Z9049 Acquired absence of other specified parts of digestive tract: Secondary | ICD-10-CM | POA: Insufficient documentation

## 2017-10-26 DIAGNOSIS — Z87891 Personal history of nicotine dependence: Secondary | ICD-10-CM | POA: Diagnosis not present

## 2017-10-26 DIAGNOSIS — Z886 Allergy status to analgesic agent status: Secondary | ICD-10-CM | POA: Insufficient documentation

## 2017-10-26 DIAGNOSIS — Z885 Allergy status to narcotic agent status: Secondary | ICD-10-CM | POA: Insufficient documentation

## 2017-10-26 HISTORY — PX: COLONOSCOPY WITH PROPOFOL: SHX5780

## 2017-10-26 SURGERY — COLONOSCOPY WITH PROPOFOL
Anesthesia: General

## 2017-10-26 MED ORDER — MIDAZOLAM HCL 2 MG/2ML IJ SOLN
INTRAMUSCULAR | Status: AC
  Filled 2017-10-26: qty 2

## 2017-10-26 MED ORDER — PROPOFOL 500 MG/50ML IV EMUL
INTRAVENOUS | Status: AC
  Filled 2017-10-26: qty 50

## 2017-10-26 MED ORDER — MIDAZOLAM HCL 2 MG/2ML IJ SOLN
INTRAMUSCULAR | Status: DC | PRN
  Administered 2017-10-26 (×2): 1 mg via INTRAVENOUS

## 2017-10-26 MED ORDER — FENTANYL CITRATE (PF) 100 MCG/2ML IJ SOLN
INTRAMUSCULAR | Status: AC
  Filled 2017-10-26: qty 2

## 2017-10-26 MED ORDER — LIDOCAINE HCL (CARDIAC) 20 MG/ML IV SOLN
INTRAVENOUS | Status: DC | PRN
  Administered 2017-10-26: 2 mL via INTRAVENOUS

## 2017-10-26 MED ORDER — PHENYLEPHRINE HCL 10 MG/ML IJ SOLN
INTRAMUSCULAR | Status: DC | PRN
  Administered 2017-10-26: 100 ug via INTRAVENOUS

## 2017-10-26 MED ORDER — PHENYLEPHRINE HCL 10 MG/ML IJ SOLN
INTRAMUSCULAR | Status: AC
  Filled 2017-10-26: qty 1

## 2017-10-26 MED ORDER — SODIUM CHLORIDE 0.9 % IV SOLN: INTRAVENOUS | Status: DC

## 2017-10-26 MED ORDER — LIDOCAINE HCL (PF) 2 % IJ SOLN
INTRAMUSCULAR | Status: AC
  Filled 2017-10-26: qty 10

## 2017-10-26 MED ORDER — FENTANYL CITRATE (PF) 100 MCG/2ML IJ SOLN
INTRAMUSCULAR | Status: DC | PRN
  Administered 2017-10-26 (×2): 50 ug via INTRAVENOUS

## 2017-10-26 MED ORDER — PROPOFOL 10 MG/ML IV BOLUS
INTRAVENOUS | Status: DC | PRN
  Administered 2017-10-26 (×3): 30 mg via INTRAVENOUS
  Administered 2017-10-26: 20 mg via INTRAVENOUS
  Administered 2017-10-26: 30 mg via INTRAVENOUS

## 2017-10-26 NOTE — Interval H&P Note (Signed)
History and Physical Interval Note:  10/26/2017 8:07 AM  Hardie Pulley  has presented today for surgery, with the diagnosis of HX COLON POLYP  The various methods of treatment have been discussed with the patient and family. After consideration of risks, benefits and other options for treatment, the patient has consented to  Procedure(s): COLONOSCOPY WITH PROPOFOL (N/A) as a surgical intervention .  The patient's history has been reviewed, patient examined, no change in status, stable for surgery.  I have reviewed the patient's chart and labs.  Questions were answered to the patient's satisfaction.     Kayda Allers G

## 2017-10-26 NOTE — Anesthesia Postprocedure Evaluation (Signed)
Anesthesia Post Note  Patient: Diana Rodriguez  Procedure(s) Performed: COLONOSCOPY WITH PROPOFOL (N/A )  Patient location during evaluation: PACU Anesthesia Type: General Level of consciousness: awake Pain management: pain level controlled Vital Signs Assessment: post-procedure vital signs reviewed and stable Respiratory status: nonlabored ventilation Cardiovascular status: stable Anesthetic complications: no     Last Vitals:  Vitals:   10/26/17 0850 10/26/17 0900  BP: 121/63 135/61  Pulse: 68 64  Resp: 15 (!) 21  Temp:    SpO2: 100% 100%    Last Pain:  Vitals:   10/26/17 0830  TempSrc: Tympanic                 VAN STAVEREN,Alphonsine Minium

## 2017-10-26 NOTE — Transfer of Care (Signed)
Immediate Anesthesia Transfer of Care Note  Patient: Diana Rodriguez  Procedure(s) Performed: COLONOSCOPY WITH PROPOFOL (N/A )  Patient Location: PACU  Anesthesia Type:General  Level of Consciousness: awake  Airway & Oxygen Therapy: Patient Spontanous Breathing and Patient connected to nasal cannula oxygen  Post-op Assessment: Report given to RN and Post -op Vital signs reviewed and stable  Post vital signs: Reviewed  Last Vitals: There were no vitals filed for this visit.  Last Pain: There were no vitals filed for this visit.       Complications: No apparent anesthesia complications

## 2017-10-26 NOTE — Anesthesia Post-op Follow-up Note (Signed)
Anesthesia QCDR form completed.        

## 2017-10-26 NOTE — Op Note (Signed)
[facility] Patient Name: Diana Rodriguez Procedure Date: 10/26/2017 7:59 AM MRN: [id number] Account #: [id number] Date of Birth: [birth year] Admit Type: Outpatient Age: 61 Room: [facility] ENDO ROOM 1 Gender: Female Note Status: Finalized Procedure:            Colonoscopy Indications:          High risk colon cancer surveillance: Personal history                        of colonic polyps Providers:            Seeplaputhur G. Jamal Collin, MD Referring MD:         Irven Easterly. Kary Kos, MD (Referring MD) Medicines:            General Anesthesia Complications:        No immediate complications. Procedure:            Pre-Anesthesia Assessment:                       - General anesthesia under the supervision of an                        anesthesiologist was determined to be medically                        necessary for this procedure based on review of the                        patient's medical history, medications, and prior                        anesthesia history.                       After obtaining informed consent, the colonoscope was                        passed under direct vision. Throughout the procedure,                        the patient's blood pressure, pulse, and oxygen                        saturations were monitored continuously. The                        Colonoscope was introduced through the anus and                        advanced to the the ileocolonic anastomosis. The                        colonoscopy was performed without difficulty. The                        patient tolerated the procedure well. The quality of                        the bowel preparation was excellent. Findings:      The perianal and digital rectal examinations were normal.      The entire examined colon appeared normal  on direct and retroflexion       views. Impression:           - The entire examined colon is normal on direct and                        retroflexion  views.                       - No specimens collected. Recommendation:       - Discharge patient to home.                       - Resume previous diet.                       - Continue present medications.                       - Repeat colonoscopy in 5 years for surveillance. Procedure Code(s):    --- Professional ---                       [id number], Colonoscopy, flexible; diagnostic, including                        collection of specimen(s) by brushing or washing, when                        performed (separate procedure) Diagnosis Code(s):    --- Professional ---                       Z86.010, Personal history of colonic polyps CPT copyright 2016 American Medical Association. All rights reserved. The codes documented in this report are preliminary and upon coder review may  be revised to meet current compliance requirements. Christene Lye, MD 10/26/2017 8:36:46 AM This report has been signed electronically. Number of Addenda: 0 Note Initiated On: 10/26/2017 7:59 AM Scope Withdrawal Time: 0 hours 11 minutes 8 seconds  Total Procedure Duration: 0 hours 22 minutes 33 seconds       [facility]

## 2017-10-26 NOTE — Anesthesia Preprocedure Evaluation (Signed)
Anesthesia Evaluation  Patient identified by MRN, date of birth, ID band Patient awake    Reviewed: Allergy & Precautions  Airway Mallampati: III       Dental  (+) Teeth Intact   Pulmonary neg pulmonary ROS, former smoker,    breath sounds clear to auscultation       Cardiovascular Exercise Tolerance: Good  Rhythm:Regular Rate:Normal     Neuro/Psych negative neurological ROS  negative psych ROS   GI/Hepatic negative GI ROS, Neg liver ROS,   Endo/Other  negative endocrine ROS  Renal/GU negative Renal ROS     Musculoskeletal negative musculoskeletal ROS (+)   Abdominal Normal abdominal exam  (+)   Peds negative pediatric ROS (+)  Hematology negative hematology ROS (+) anemia ,   Anesthesia Other Findings   Reproductive/Obstetrics                             Anesthesia Physical Anesthesia Plan  ASA: II  Anesthesia Plan: General   Post-op Pain Management:    Induction: Intravenous  PONV Risk Score and Plan: 0  Airway Management Planned: Natural Airway and Nasal Cannula  Additional Equipment:   Intra-op Plan:   Post-operative Plan:   Informed Consent: I have reviewed the patients History and Physical, chart, labs and discussed the procedure including the risks, benefits and alternatives for the proposed anesthesia with the patient or authorized representative who has indicated his/her understanding and acceptance.     Plan Discussed with: Surgeon  Anesthesia Plan Comments:         Anesthesia Quick Evaluation

## 2017-10-26 NOTE — H&P (Signed)
Diana Rodriguez is an 61 y.o. female.   Chief Complaint:Here for colonoscopy HPI: 61 yr old female old female  S/p right colectomy for large adenomatous polyp. Here for surveillance colonoscopy. No GI complaints.  Past Medical History:  Diagnosis Date  . Anemia   . BRCA negative 02/17/2017   variant of uncertain significant  . Colon polyps   . Complication of anesthesia    PT WAS TOLD SHE HAS A SMALL AIRWAY-PT GETS SEVER SHAKES AFTER GENERAL ANESTHESIA  . Osteoporosis     Past Surgical History:  Procedure Laterality Date  . ABDOMINAL HYSTERECTOMY    . APPENDECTOMY    . CHOLECYSTECTOMY    . COLONOSCOPY WITH PROPOFOL N/A 08/23/2016   Procedure: COLONOSCOPY WITH PROPOFOL;  Surgeon: Lollie Sails, MD;  Location: [facility] ENDOSCOPY;  Service: Endoscopy;  Laterality: N/A;  . DIAGNOSTIC LAPAROSCOPY    . ENDOSCOPIC RETROGRADE CHOLANGIOPANCREATOGRAPHY (ERCP) WITH PROPOFOL N/A 02/14/2017   Procedure: ENDOSCOPIC RETROGRADE CHOLANGIOPANCREATOGRAPHY (ERCP) WITH PROPOFOL;  Surgeon: Lucilla Lame, MD;  Location: [facility] ENDOSCOPY;  Service: Endoscopy;  Laterality: N/A;  . LAPAROSCOPIC OOPHERECTOMY    . LAPAROSCOPIC RIGHT COLECTOMY Right 12/20/2016   Procedure: LAPAROSCOPIC RIGHT COLECTOMY;  Surgeon: Christene Lye, MD;  Location: [facility] ORS;  Service: General;  Laterality: Right;  . UMBILICAL HERNIA REPAIR  12/20/2016   Procedure: HERNIA REPAIR UMBILICAL ADULT;  Surgeon: Christene Lye, MD;  Location: [facility] ORS;  Service: General;;    Family History  Problem Relation Age of Onset  . Cancer Mother 62       lung  . Cancer Father 42       lung  . Cancer Paternal Aunt 80       breast and colon cancer  . Ovarian cancer Paternal Aunt 25       ovarian and colon  . Breast cancer Paternal Aunt 21   Social History:  reports that she quit smoking about 26 years ago. Her smoking use included cigarettes. She has a 7.50 pack-year smoking history. she has never used smokeless tobacco. She reports that she drinks  alcohol. She reports that she does not use drugs.  Allergies:  Allergies  Allergen Reactions  . Erythromycin Anaphylaxis  . Advil [Ibuprofen] Swelling  . Aspirin Other (See Comments)    Pt reports her father was allergic to aspirin and concerned she may be allergic also  . Cephalexin Hives  . Doxycycline Hives  . Penicillins Hives    Has patient had a PCN reaction causing immediate rash, facial/tongue/throat swelling, SOB or lightheadedness with hypotension:unsure Has patient had a PCN reaction causing severe rash involving mucus membranes or skin necrosis:Yes Has patient had a PCN reaction that required hospitalization:No Has patient had a PCN reaction occurring within the last 10 years:No If all of the above answers are "NO", then may proceed with Cephalosporin use.   . Septra [Sulfamethoxazole-Trimethoprim] Hives  . Vicodin [Hydrocodone-Acetaminophen] Hives    Medications Prior to Admission  Medication Sig Dispense Refill  . polyethylene glycol powder (GLYCOLAX/MIRALAX) powder 255 grams one bottle for colonoscopy prep 255 g 0    No results found for this or any previous visit (from the past 69 hour(s)). No results found.  Review of Systems  Constitutional: Negative.   Respiratory: Negative.   Cardiovascular: Negative.   Gastrointestinal: Negative.   Genitourinary: Negative.     There were no vitals taken for this visit. Physical Exam  Constitutional: She is oriented to person, place, and time. She appears well-developed and well-nourished.  Eyes:  Conjunctivae are normal. No scleral icterus.  Neck: Neck supple.  Cardiovascular: Normal rate, regular rhythm and normal heart sounds.  Respiratory: Effort normal and breath sounds normal.  GI: Soft. Bowel sounds are normal. She exhibits no distension and no mass. There is no tenderness.  Lymphadenopathy:    She has no cervical adenopathy.  Neurological: She is alert and oriented to person, place, and time.  Skin: Skin is  warm and dry.     Assessment/Plan Proceed with colonoscopy as scheduled.  Christene Lye, MD 10/26/2017, 8:04 AM

## 2017-10-27 ENCOUNTER — Encounter: Payer: Self-pay | Admitting: General Surgery

## 2018-02-27 IMAGING — CR DG CHEST 2V
2 series · 2 of 2 positions shown · non-contrast
Comparison: No priors.

CLINICAL DATA: 60-year-old female with history of midsternal chest
pain since yesterday.

EXAM:
CHEST  2 VIEW

[chest pa]
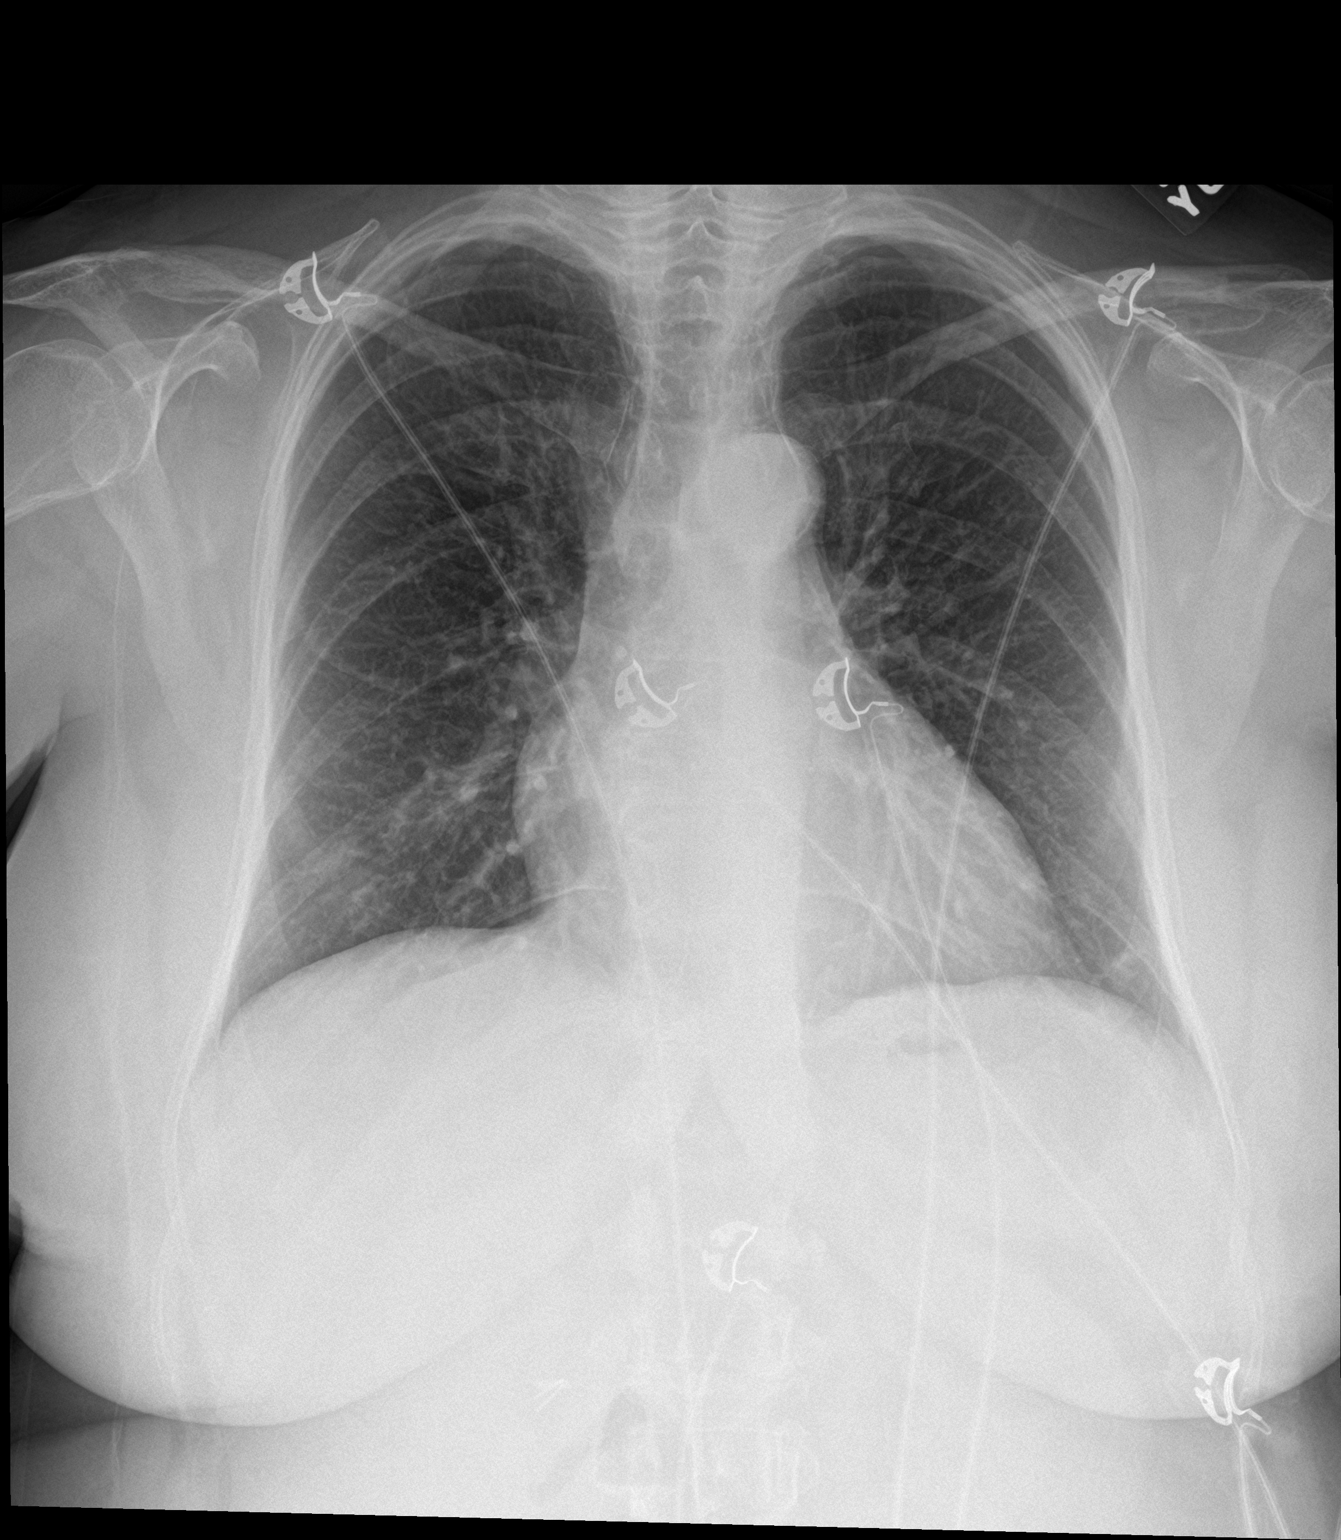

[chest lat]
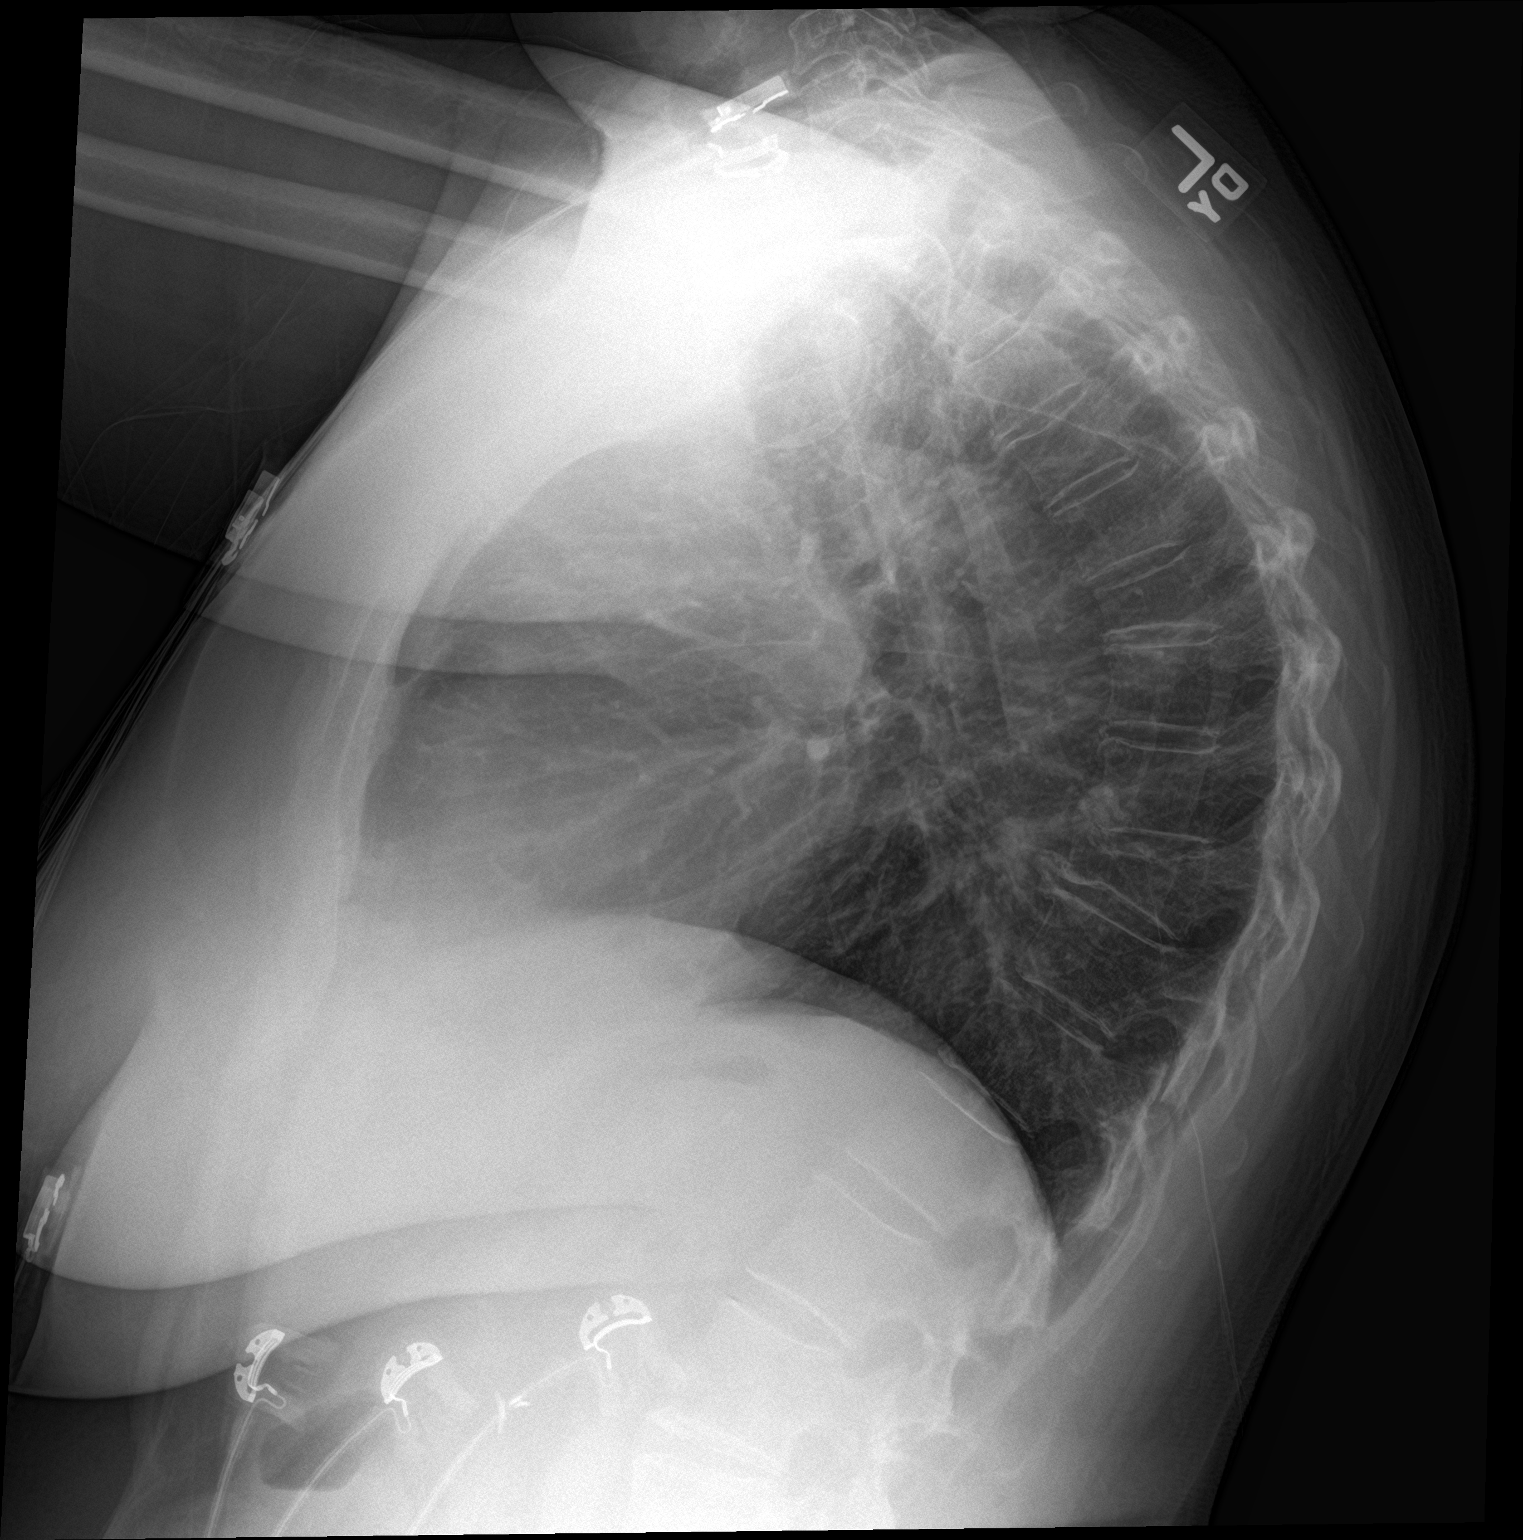

[2 of 2 positions shown; findings below may reference images not displayed]

FINDINGS: Lung volumes are normal. No consolidative airspace disease. No
pleural effusions. No pneumothorax. No pulmonary nodule or mass
noted. Pulmonary vasculature and the cardiomediastinal silhouette
are within normal limits. Aortic atherosclerosis. Lateral view
demonstrates old compression fractures of T6 and T9, most severe at
T9 where there is approximately 50% loss of anterior vertebral body
height, similar to the prior MRI of the thoracic spine 12/09/2004.
Surgical clips project over the right upper quadrant of the abdomen,
likely from prior cholecystectomy.
IMPRESSION: 1. No radiographic evidence of acute cardiopulmonary disease.
2. Aortic atherosclerosis.
3. Old compression fractures of T6 and T9 appear unchanged.

## 2018-02-27 IMAGING — CT CT ABD-PELV W/ CM
2 of 5 series · 15 of 46 positions shown, 17 images · IV contrast (APPLIED)
Comparison: Chest radiographs obtained earlier today.

CLINICAL DATA: Chest pain since 8 p.m. last night. The examination
was ordered with a history of abdominal pain and elevated liver
function tests. Previous appendectomy and right colectomy
approximately 8 weeks ago for colon cancer. History of breast
cancer.

EXAM:
CT ABDOMEN AND PELVIS WITH CONTRAST
TECHNIQUE: Multidetector CT imaging of the abdomen and pelvis was performed
using the standard protocol following bolus administration of
intravenous contrast.
CONTRAST:  100mL [id number] IOPAMIDOL ([id number]) INJECTION 61%

[Series 2: routine abd/pel with · axial · 0.90mm/px · z∈[-1140,-745]mm · 12 of 89 slices shown, 14 images]
[im 5/89  soft-tissue]
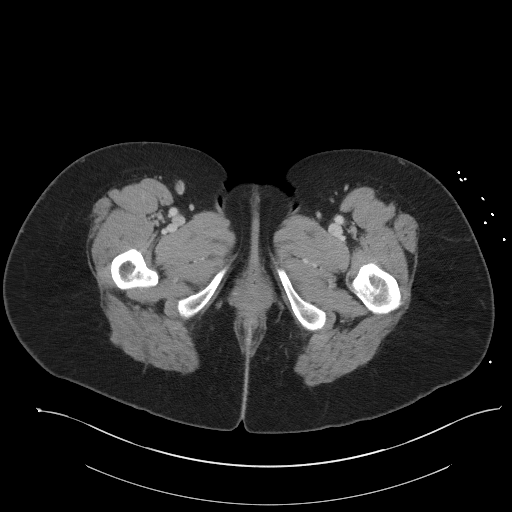
[im 5/89  bone]
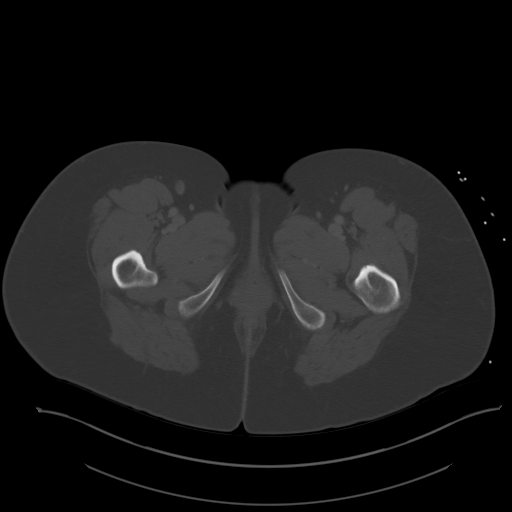
[im 14/89  soft-tissue]
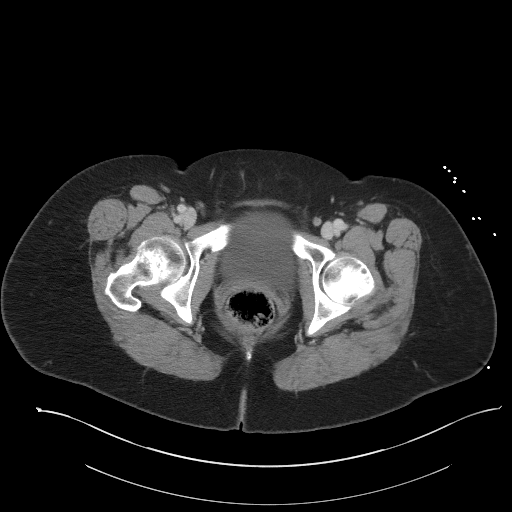
[im 19/89  soft-tissue]
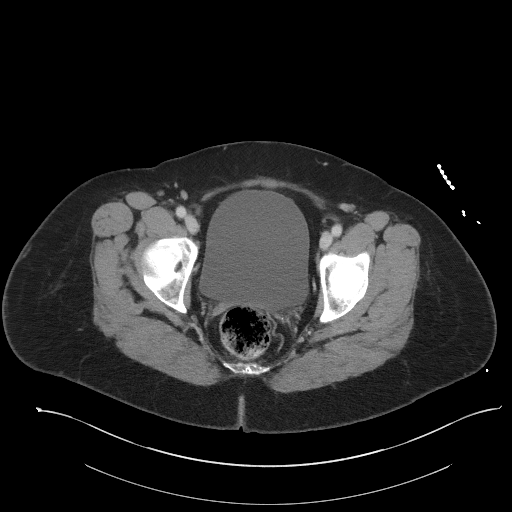
[im 28/89  soft-tissue]
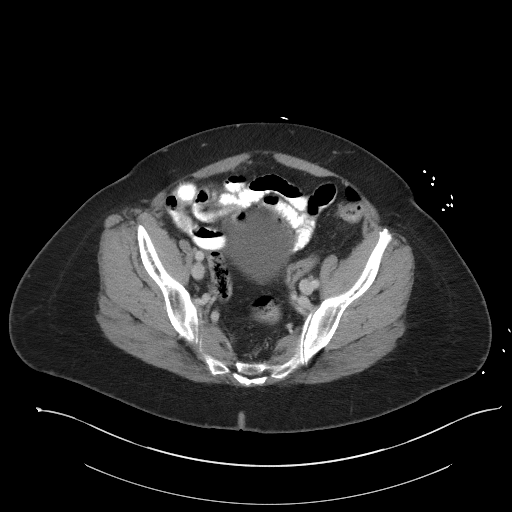
[im 33/89  soft-tissue]
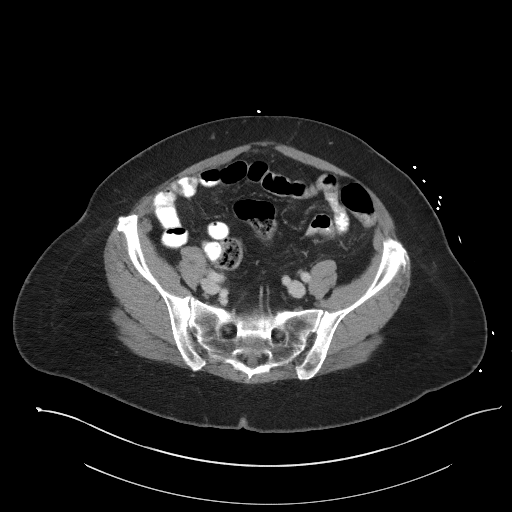
[im 42/89  soft-tissue]
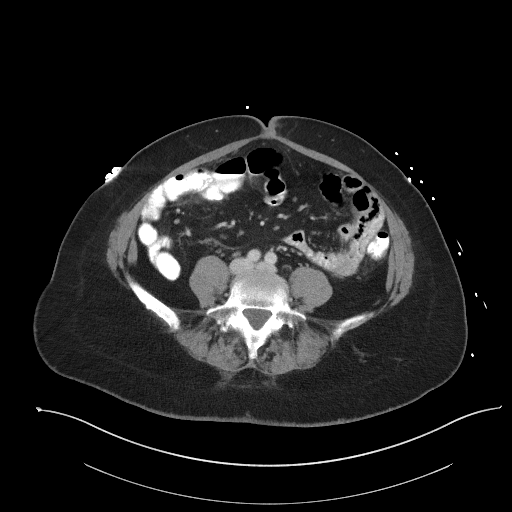
[im 47/89  soft-tissue]
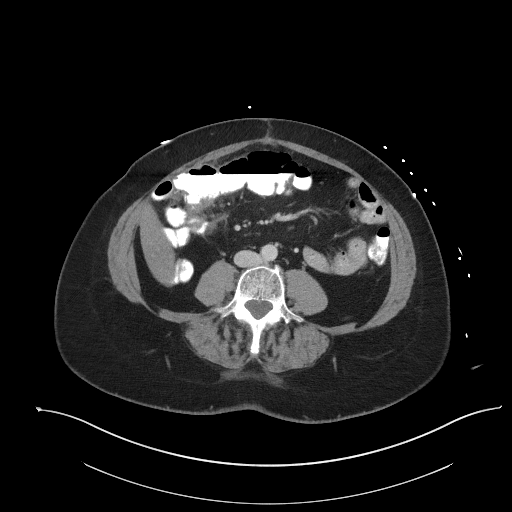
[im 56/89  soft-tissue]
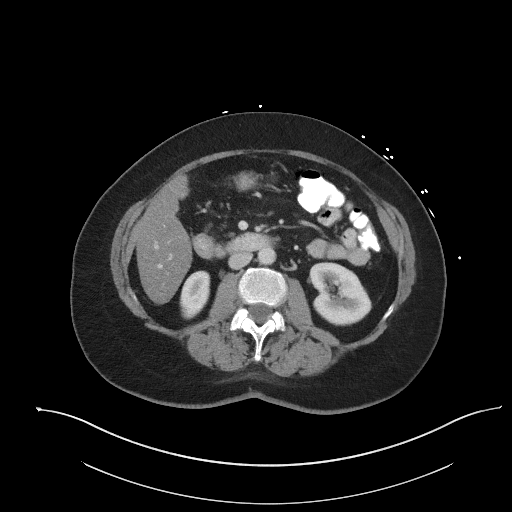
[im 61/89  soft-tissue]
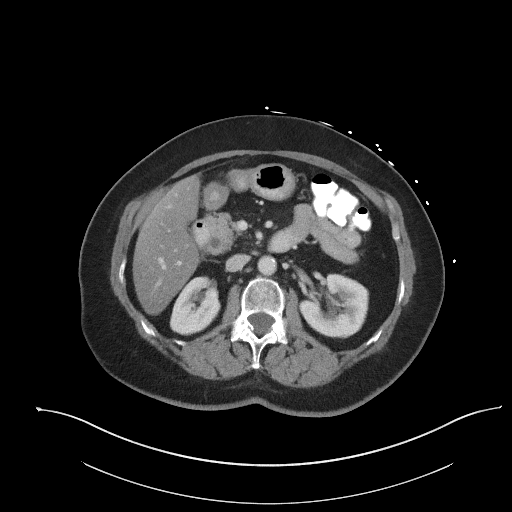
[im 61/89  bone]
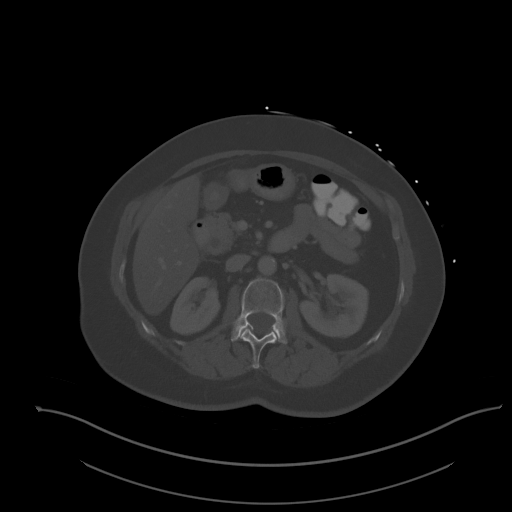
[im 70/89  soft-tissue]
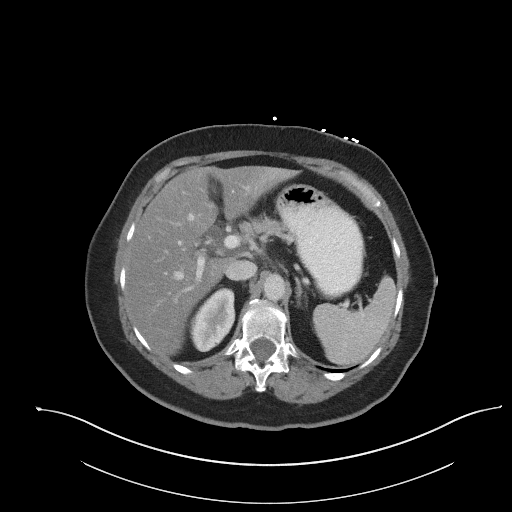
[im 75/89  soft-tissue]
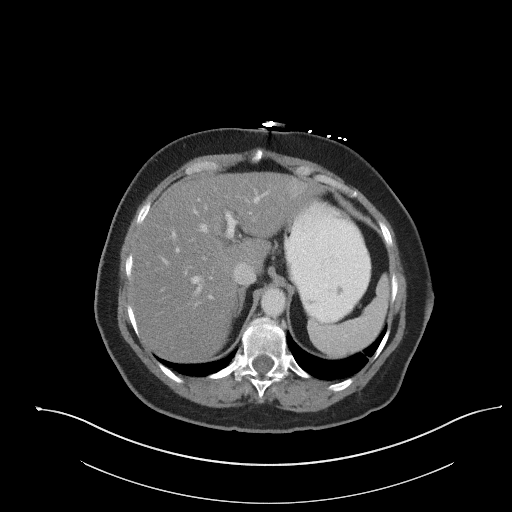
[im 84/89  soft-tissue]
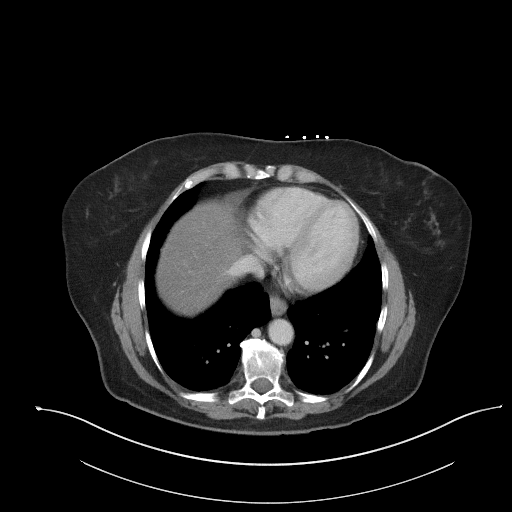

[Series 5: coronal st · coronal · 0.68mm/px · 3 of 94 slices shown]
[im 32/94  soft-tissue]
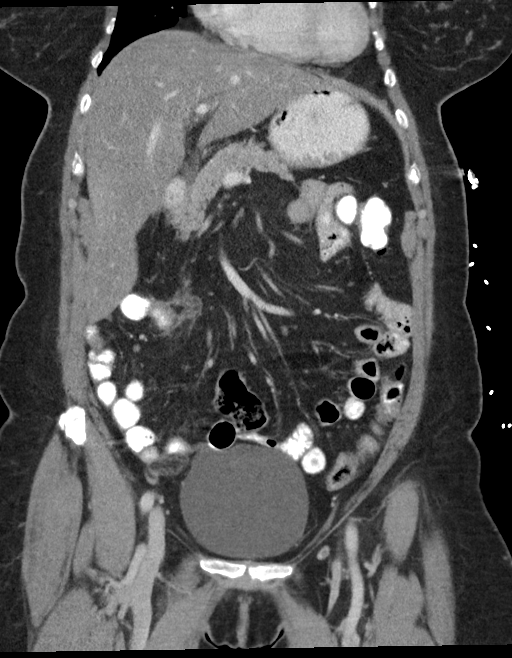
[im 42/94  soft-tissue]
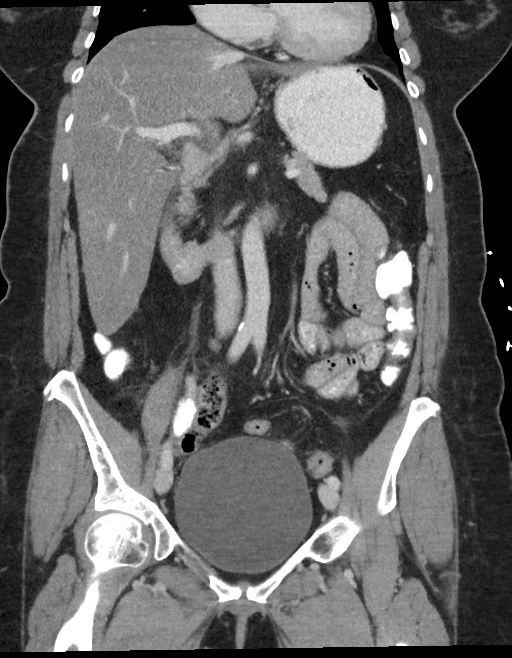
[im 52/94  soft-tissue]
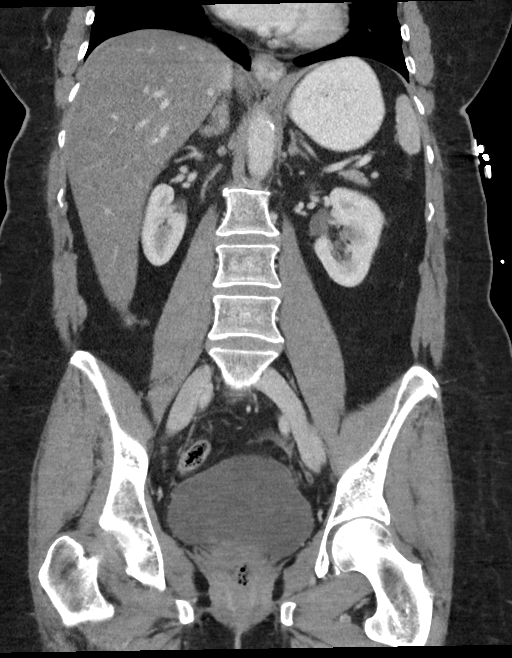

[15 of 46 positions shown; findings below may reference images not displayed]

FINDINGS: Lower chest: Small amount of linear density at the left lung base.
Minimal bilateral dependent atelectasis.

Hepatobiliary: Diffuse low density of the liver relative to the
spleen. 9 mm medial segment right lobe liver low density mass with
questionable mild peripheral contrast puddling on the delayed images
through the kidneys. Cholecystectomy clips.

Pancreas: Unremarkable. No pancreatic ductal dilatation or
surrounding inflammatory changes.

Spleen: Normal in size without focal abnormality.

Adrenals/Urinary Tract: Adrenal glands are unremarkable. Small right
renal cyst. Otherwise, the kidneys are normal, without renal
calculi, focal lesion, or hydronephrosis. Bladder is unremarkable.

Stomach/Bowel: The inferior portion of the right colon is surgically
absent. There is wall thickening and adjacent soft tissue stranding
involving the remaining inferior portion of the right colon and
small bowel at the anastomosis of the small bowel with the colon. No
bowel dilatation. The stomach and small bowel are unremarkable.

Vascular/Lymphatic: Mild atheromatous aortic calcifications. Mildly
prominent right lower quadrant mesenteric lymph node with a short
axis diameter of 6 mm on image number 47. No abnormally enlarged
lymph nodes are seen.

Reproductive: Surgically absent uterus. Surgical clip in the region
of the vaginal cuff on the left. No adnexal masses.

Other: Oval area of fat density with surrounding soft tissue
stranding in the right mid pelvis, anteriorly. This measures 4.0 x
1.4 cm on coronal image number 30.

Musculoskeletal: Lumbar and lower thoracic spine degenerative
changes. Approximately 40% T9 vertebral compression deformity
without bony retropulsion and no acute fracture lines.
IMPRESSION: 1. Status post partial right colectomy with wall thickening and
inflammatory changes of the anastomosis, involving the colon and
distal small bowel. Residual or recurrent malignancy could have this
appearance. Infectious or inflammatory changes could also have this
appearance.
2. 4.0 cm area of probable fat necrosis in the right pelvis.
3. 9 mm right lobe liver mass. This most likely represents a cyst or
hemangioma. A solitary metastasis is less likely but not excluded.
4. Diffuse hepatic steatosis.
5. Probable minimally reactive right lower quadrant mesenteric lymph
node. An early metastatic node is not excluded.

## 2018-03-30 ENCOUNTER — Other Ambulatory Visit: Payer: Self-pay | Admitting: Family Medicine

## 2018-03-30 DIAGNOSIS — Z1231 Encounter for screening mammogram for malignant neoplasm of breast: Secondary | ICD-10-CM

## 2018-04-20 ENCOUNTER — Ambulatory Visit: Payer: Commercial Managed Care - PPO

## 2018-04-25 ENCOUNTER — Ambulatory Visit: Payer: Commercial Managed Care - PPO

## 2018-05-01 ENCOUNTER — Ambulatory Visit
Admission: RE | Admit: 2018-05-01 | Discharge: 2018-05-01 | Disposition: A | Discharge status: Home / Self Care | Payer: Commercial Managed Care - PPO | Source: Ambulatory Visit | Attending: Family Medicine | Admitting: Family Medicine

## 2018-05-01 DIAGNOSIS — Z1231 Encounter for screening mammogram for malignant neoplasm of breast: Secondary | ICD-10-CM

## 2018-05-07 ENCOUNTER — Other Ambulatory Visit: Payer: Self-pay | Admitting: Family Medicine

## 2018-05-07 DIAGNOSIS — M810 Age-related osteoporosis without current pathological fracture: Secondary | ICD-10-CM

## 2019-05-01 ENCOUNTER — Other Ambulatory Visit: Payer: Self-pay | Admitting: Cardiology

## 2019-05-01 DIAGNOSIS — R079 Chest pain, unspecified: Secondary | ICD-10-CM | POA: Diagnosis present

## 2019-05-01 DIAGNOSIS — Z01812 Encounter for preprocedural laboratory examination: Secondary | ICD-10-CM

## 2019-05-01 MED ORDER — SODIUM CHLORIDE 0.9% FLUSH: 3.0000 mL | Freq: Two times a day (BID) | INTRAVENOUS | Status: AC

## 2019-05-07 ENCOUNTER — Other Ambulatory Visit: Admission: RE | Admit: 2019-05-07 | Payer: Commercial Managed Care - PPO | Source: Ambulatory Visit

## 2019-05-08 ENCOUNTER — Other Ambulatory Visit: Payer: Self-pay

## 2019-05-08 ENCOUNTER — Other Ambulatory Visit
Admission: RE | Admit: 2019-05-08 | Discharge: 2019-05-08 | Disposition: A | Discharge status: Home / Self Care | Payer: Commercial Managed Care - PPO | Source: Ambulatory Visit | Attending: Cardiology | Admitting: Cardiology

## 2019-05-08 DIAGNOSIS — Z01812 Encounter for preprocedural laboratory examination: Secondary | ICD-10-CM | POA: Diagnosis present

## 2019-05-08 DIAGNOSIS — Z1159 Encounter for screening for other viral diseases: Secondary | ICD-10-CM | POA: Diagnosis not present

## 2019-05-08 LAB — SARS CORONAVIRUS 2 BY RT PCR (HOSPITAL ORDER, PERFORMED IN ~~LOC~~ HOSPITAL LAB): SARS Coronavirus 2: NEGATIVE

## 2019-05-10 ENCOUNTER — Encounter
Admission: RE | Disposition: A | Discharge status: Home / Self Care | Payer: Self-pay | Source: Home / Self Care | Attending: Cardiology

## 2019-05-10 ENCOUNTER — Ambulatory Visit
Admission: RE | Admit: 2019-05-10 | Discharge: 2019-05-10 | Disposition: A | Discharge status: Home / Self Care | Payer: Commercial Managed Care - PPO | Attending: Cardiology | Admitting: Cardiology

## 2019-05-10 ENCOUNTER — Encounter: Payer: Self-pay | Admitting: *Deleted

## 2019-05-10 ENCOUNTER — Other Ambulatory Visit: Payer: Self-pay

## 2019-05-10 DIAGNOSIS — Z881 Allergy status to other antibiotic agents status: Secondary | ICD-10-CM | POA: Insufficient documentation

## 2019-05-10 DIAGNOSIS — Z880 Allergy status to penicillin: Secondary | ICD-10-CM | POA: Insufficient documentation

## 2019-05-10 DIAGNOSIS — Z79899 Other long term (current) drug therapy: Secondary | ICD-10-CM | POA: Diagnosis not present

## 2019-05-10 DIAGNOSIS — E782 Mixed hyperlipidemia: Secondary | ICD-10-CM | POA: Diagnosis not present

## 2019-05-10 DIAGNOSIS — Z886 Allergy status to analgesic agent status: Secondary | ICD-10-CM | POA: Diagnosis not present

## 2019-05-10 DIAGNOSIS — E079 Disorder of thyroid, unspecified: Secondary | ICD-10-CM | POA: Insufficient documentation

## 2019-05-10 DIAGNOSIS — Z90710 Acquired absence of both cervix and uterus: Secondary | ICD-10-CM | POA: Diagnosis not present

## 2019-05-10 DIAGNOSIS — I10 Essential (primary) hypertension: Secondary | ICD-10-CM | POA: Insufficient documentation

## 2019-05-10 DIAGNOSIS — Z8249 Family history of ischemic heart disease and other diseases of the circulatory system: Secondary | ICD-10-CM | POA: Diagnosis not present

## 2019-05-10 DIAGNOSIS — R0789 Other chest pain: Secondary | ICD-10-CM | POA: Insufficient documentation

## 2019-05-10 DIAGNOSIS — Z885 Allergy status to narcotic agent status: Secondary | ICD-10-CM | POA: Insufficient documentation

## 2019-05-10 DIAGNOSIS — M8580 Other specified disorders of bone density and structure, unspecified site: Secondary | ICD-10-CM | POA: Insufficient documentation

## 2019-05-10 DIAGNOSIS — Z87891 Personal history of nicotine dependence: Secondary | ICD-10-CM | POA: Insufficient documentation

## 2019-05-10 DIAGNOSIS — Z882 Allergy status to sulfonamides status: Secondary | ICD-10-CM | POA: Insufficient documentation

## 2019-05-10 DIAGNOSIS — R5383 Other fatigue: Secondary | ICD-10-CM | POA: Diagnosis not present

## 2019-05-10 DIAGNOSIS — R0602 Shortness of breath: Secondary | ICD-10-CM | POA: Diagnosis not present

## 2019-05-10 DIAGNOSIS — R079 Chest pain, unspecified: Secondary | ICD-10-CM | POA: Diagnosis present

## 2019-05-10 DIAGNOSIS — Z823 Family history of stroke: Secondary | ICD-10-CM | POA: Diagnosis not present

## 2019-05-10 HISTORY — PX: LEFT HEART CATH AND CORONARY ANGIOGRAPHY: CATH118249

## 2019-05-10 SURGERY — LEFT HEART CATH AND CORONARY ANGIOGRAPHY
Anesthesia: Moderate Sedation | Laterality: Left

## 2019-05-10 MED ORDER — ACETAMINOPHEN 325 MG PO TABS
650.0000 mg | ORAL_TABLET | ORAL | Status: DC | PRN
  Administered 2019-05-10: 650 mg via ORAL

## 2019-05-10 MED ORDER — SODIUM CHLORIDE 0.9 % WEIGHT BASED INFUSION
3.0000 mL/kg/h | INTRAVENOUS | Status: AC
  Administered 2019-05-10: 3 mL/kg/h via INTRAVENOUS

## 2019-05-10 MED ORDER — ONDANSETRON HCL 4 MG/2ML IJ SOLN: 4.0000 mg | Freq: Four times a day (QID) | INTRAMUSCULAR | Status: DC | PRN

## 2019-05-10 MED ORDER — SODIUM CHLORIDE 0.9 % WEIGHT BASED INFUSION: 1.0000 mL/kg/h | INTRAVENOUS | Status: DC

## 2019-05-10 MED ORDER — HEPARIN (PORCINE) IN NACL 1000-0.9 UT/500ML-% IV SOLN
INTRAVENOUS | Status: AC
  Filled 2019-05-10: qty 1000

## 2019-05-10 MED ORDER — IOHEXOL 300 MG/ML  SOLN
INTRAMUSCULAR | Status: DC | PRN
  Administered 2019-05-10: 75 mL via INTRA_ARTERIAL

## 2019-05-10 MED ORDER — SODIUM CHLORIDE 0.9% FLUSH: 3.0000 mL | INTRAVENOUS | Status: DC | PRN

## 2019-05-10 MED ORDER — LABETALOL HCL 5 MG/ML IV SOLN: 10.0000 mg | INTRAVENOUS | Status: DC | PRN

## 2019-05-10 MED ORDER — HEPARIN (PORCINE) IN NACL 1000-0.9 UT/500ML-% IV SOLN
INTRAVENOUS | Status: DC | PRN
  Administered 2019-05-10: 500 mL

## 2019-05-10 MED ORDER — ACETAMINOPHEN 325 MG PO TABS
ORAL_TABLET | ORAL | Status: AC
  Filled 2019-05-10: qty 2

## 2019-05-10 MED ORDER — MIDAZOLAM HCL 2 MG/2ML IJ SOLN
INTRAMUSCULAR | Status: AC
  Filled 2019-05-10: qty 2

## 2019-05-10 MED ORDER — ASPIRIN 81 MG PO CHEW
CHEWABLE_TABLET | ORAL | Status: AC
  Filled 2019-05-10: qty 1

## 2019-05-10 MED ORDER — SODIUM CHLORIDE 0.9% FLUSH: 3.0000 mL | Freq: Two times a day (BID) | INTRAVENOUS | Status: DC

## 2019-05-10 MED ORDER — SODIUM CHLORIDE 0.9 % IV SOLN: 250.0000 mL | INTRAVENOUS | Status: DC | PRN

## 2019-05-10 MED ORDER — HYDRALAZINE HCL 20 MG/ML IJ SOLN: 10.0000 mg | INTRAMUSCULAR | Status: DC | PRN

## 2019-05-10 MED ORDER — MIDAZOLAM HCL 2 MG/2ML IJ SOLN
INTRAMUSCULAR | Status: DC | PRN
  Administered 2019-05-10 (×2): 1 mg via INTRAVENOUS

## 2019-05-10 MED ORDER — FENTANYL CITRATE (PF) 100 MCG/2ML IJ SOLN
INTRAMUSCULAR | Status: AC
  Filled 2019-05-10: qty 2

## 2019-05-10 MED ORDER — ASPIRIN 81 MG PO CHEW: 81.0000 mg | CHEWABLE_TABLET | ORAL | Status: DC

## 2019-05-10 SURGICAL SUPPLY — 11 items
CATH INFINITI 5 FR 3DRC (CATHETERS) ×2 IMPLANT
CATH INFINITI 5FR ANG PIGTAIL (CATHETERS) ×2 IMPLANT
CATH INFINITI 5FR JL4 (CATHETERS) ×2 IMPLANT
CATH INFINITI JR4 5F (CATHETERS) ×2 IMPLANT
DEVICE CLOSURE MYNXGRIP 5F (Vascular Products) ×2 IMPLANT
KIT MANI 3VAL PERCEP (MISCELLANEOUS) ×3 IMPLANT
NDL PERC 18GX7CM (NEEDLE) IMPLANT
NEEDLE PERC 18GX7CM (NEEDLE) ×3 IMPLANT
PACK CARDIAC CATH (CUSTOM PROCEDURE TRAY) ×3 IMPLANT
SHEATH AVANTI 5FR X 11CM (SHEATH) ×2 IMPLANT
WIRE GUIDERIGHT .035X150 (WIRE) ×2 IMPLANT

## 2019-05-10 NOTE — H&P (Signed)
Chief Complaint: Chief Complaint  Patient presents with  . Establish Care  . Shortness of Breath  . Fatigue  . Chest Pain  . Hypertension  Date of Service: 05/01/2019 Date of Birth: Mar 19, 1956 PCP: Lovie Macadamia, MD  History of Present Illness: Ms. Diana Rodriguez is a 63 y.o.female patient who resents for evaluation after developing exertional chest pain and shortness of breath. Patient has strong family history for heart disease, history of hypertension. She began noticing exertional chest tightness with resolution with rest. This was reproduced on her stress echo. She has been on Norvasc at 2.5 mg daily and Questran for hyperlipidemia. She has no rest symptoms.  Past Medical and Surgical History  Past Medical History Past Medical History:  Diagnosis Date  . Allergic state  . Anemia  . Hyperlipidemia  . Osteopenia  . Thyroid disease  . Tubular adenoma of colon, unspecified 08/23/2016  . 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   Medications and Allergies  Current Medications  Current Outpatient Medications  Medication Sig Dispense Refill  . amLODIPine (NORVASC) 2.5 MG tablet Take 1 tablet (2.5 mg total) by mouth once daily 30 tablet 11  . cholestyramine (QUESTRAN) 4 gram oral powder packet Take 1 packet (4 g total) by mouth 2 (two) times daily before meals Mix dose in 60-180 mL of water, milk or juice. 60 each 12   No current facility-administered medications for this visit.   Allergies: Advil [ibuprofen]; Black cohosh; Ceclor [cefaclor]; Cephalexin; Darvocet a500 [propoxyphene n-acetaminophen]; Doxycycline; Erythromycin; Penicillins; Septra [sulfamethoxazole-trimethoprim]; and Vicodin [hydrocodone-acetaminophen]  Social and Family History  Social History reports  that she has quit smoking. She quit smokeless tobacco use about 28 years ago. She reports current alcohol use.  Family History Family History  Problem Relation Age of Onset  . Coronary Artery Disease (Blocked arteries around heart) Mother  . High blood pressure (Hypertension) Mother  . Stroke Mother  . Diabetes type II Mother  . Lung cancer Mother  . Coronary Artery Disease (Blocked arteries around heart) Father  . High blood pressure (Hypertension) Father  . Diabetes type II Father  . Coronary Artery Disease (Blocked arteries around heart) Sister  . High blood pressure (Hypertension) Sister  . Diabetes type II Sister  . Breast cancer Maternal Aunt  . Diabetes type II Maternal Grandmother  . High blood pressure (Hypertension) Paternal Grandmother  . Stroke Paternal Grandmother  . Diabetes type II Paternal Grandmother  . Osteoporosis (Thinning of bones) Paternal Grandmother  . Diabetes type II Brother 50  2 brothers  . Coronary Artery Disease (Blocked arteries around heart) Paternal Aunt   Review of Systems  Review of Systems  Constitutional: Negative for chills, diaphoresis, fever, malaise/fatigue and weight loss.  HENT: Negative for congestion, ear discharge, hearing loss and tinnitus.  Eyes: Negative for blurred vision.  Respiratory: Positive for shortness of breath. Negative for cough, hemoptysis, sputum production and wheezing.  Cardiovascular: Positive for chest pain. Negative for palpitations, orthopnea, claudication, leg swelling and PND.  Gastrointestinal: Negative for abdominal pain, blood in stool, constipation, diarrhea, heartburn, melena, nausea and vomiting.  Genitourinary: Negative for dysuria, frequency, hematuria and urgency.  Musculoskeletal: Negative for back pain, falls, joint pain and myalgias.  Skin: Negative for itching and rash.  Neurological: Negative for dizziness, tingling, focal weakness, loss of consciousness, weakness and headaches.   Endo/Heme/Allergies: Negative  for polydipsia. Does not bruise/bleed easily.  Psychiatric/Behavioral: Negative for depression, memory loss and substance abuse. The patient is not nervous/anxious.   Physical Examination   Vitals:BP 160/84  Pulse 76  Resp 16  Ht 152.4 cm (5')  Wt 81.9 kg (180 lb 9.6 oz)  BMI 35.27 kg/m  Ht:152.4 cm (5') Wt:81.9 kg (180 lb 9.6 oz) WUX:LKGM surface area is 1.86 meters squared. Body mass index is 35.27 kg/m.  Wt Readings from Last 3 Encounters:  05/01/19 81.9 kg (180 lb 9.6 oz)  02/07/19 85.7 kg (189 lb)  10/04/18 83 kg (183 lb)   BP Readings from Last 3 Encounters:  05/01/19 160/84  02/07/19 (!) 150/90  10/04/18 130/86   General appearance appears in no acute distress  Head Mouth and Eye exam Normocephalic, without obvious abnormality, atraumatic Dentition is good Eyes appear anicteric       LUNGS Breath Sounds: Normal Percussion: Normal  CARDIOVASCULAR JVP CV wave: no HJR: no Elevation at 90 degrees: None Carotid Pulse: normal pulsation bilaterally Bruit: None Apex: apical impulse normal  Auscultation Rhythm: normal sinus rhythm S1: normal S2: normal Clicks: no Rub: no Murmurs: no murmurs  Gallop: None  EXTREMITIES Clubbing: no Edema: trace to 1+ bilateral pedal edema Pulses: peripheral pulses symmetrical Femoral Bruits: no Amputation: no SKIN Rash: no Cyanosis: no Embolic phemonenon: no Bruising: no NEURO Alert and Oriented to person, place and time: yes Non focal: yes  PSYCH: Pt appears to have normal affect  LABS REVIEWED Last 3 CBC results: Lab Results  Component Value Date  WBC 7.0 02/05/2019  WBC 7.5 04/27/2018  WBC 11.6 (H) 01/24/2018   Lab Results  Component Value Date  HGB 12.7 02/05/2019  HGB 12.3 04/27/2018  HGB 13.3 01/24/2018   Lab Results  Component Value Date  HCT 37.2 02/05/2019  HCT 37.5 04/27/2018  HCT 40.5 01/24/2018   Lab Results  Component Value Date  PLT 242  02/05/2019  PLT 240 04/27/2018  PLT 247 01/24/2018   Lab Results  Component Value Date  CREATININE 0.7 02/05/2019  BUN 13 02/05/2019  NA 140 02/05/2019  K 4.2 02/05/2019  CL 105 02/05/2019  CO2 28.0 02/05/2019   Lab Results  Component Value Date  HGBA1C 6.2 (H) 02/05/2019   Lab Results  Component Value Date  HDL 47.1 02/05/2019  HDL 44.8 01/24/2018  HDL 46.1 04/06/2016   Lab Results  Component Value Date  LDLCALC 103 02/05/2019  LDLCALC 105 01/24/2018  LDLCALC 107 04/06/2016   Lab Results  Component Value Date  TRIG 81 02/05/2019  TRIG 68 01/24/2018  TRIG 77 04/06/2016   Lab Results  Component Value Date  ALT 36 02/05/2019  AST 24 02/05/2019  ALKPHOS 71 02/05/2019   No results found for: TSH  Diagnostic Studies Reviewed:  EKG EKG demonstrated normal sinus rhythm, nonspecific ST and T waves changes.  Assessment and Plan   63 y.o. female with  ICD-10-CM  1. Mixed hyperlipidemia-low-fat low-cholesterol diet. E78.2  2. Chest pain with moderate risk of acute coronary syndrome-exertional chest pain typical for angina with abnormal stress echo. Will refer for left heart cath. Risk and benefits of cardiac cath were explained to the patient she agrees to proceed. We will preop evaluate with COVID testing and proceed with left heart cath. Further recommendations after this is complete. Patient advised to avoid activity causing symptoms until study can be completed. Continue with amlodipine. W10.2 Basic Metabolic Panel (BMP)  CBC w/auto Differential (5 Part)   Return in about 3  months (around 08/01/2019).  These notes generated with voice recognition software. I apologize for typographical errors.  Sydnee Levans, MD    Pt seen and examined. No change from above.

## 2019-05-29 ENCOUNTER — Telehealth: Payer: Self-pay | Admitting: General Practice

## 2019-05-29 NOTE — Telephone Encounter (Signed)
Patient is calling said she had a colonoscopy done and was coded as a surgery and her insurance is denying her she has received a bill for this. Please call patient and advise

## 2019-06-11 NOTE — Telephone Encounter (Signed)
This account is being reviewed and under [employer].  Patient had a colonoscopy, however it was billed as a diagnostic due to Personal history of colonic polyps. Normally, colonoscopy's are covered 100% if done for screening, not diagnostic.   I have left a message on patient's VM to call back to discuss this matter.

## 2020-11-24 ENCOUNTER — Other Ambulatory Visit: Payer: Self-pay | Admitting: Family Medicine

## 2020-11-24 DIAGNOSIS — Z1231 Encounter for screening mammogram for malignant neoplasm of breast: Secondary | ICD-10-CM

## 2020-12-31 ENCOUNTER — Ambulatory Visit
Admission: RE | Admit: 2020-12-31 | Discharge: 2020-12-31 | Disposition: A | Discharge status: Home / Self Care | Payer: Commercial Managed Care - PPO | Source: Ambulatory Visit | Attending: Family Medicine | Admitting: Family Medicine

## 2020-12-31 ENCOUNTER — Other Ambulatory Visit: Payer: Self-pay

## 2020-12-31 DIAGNOSIS — Z1231 Encounter for screening mammogram for malignant neoplasm of breast: Secondary | ICD-10-CM

## 2022-09-20 ENCOUNTER — Other Ambulatory Visit: Payer: Self-pay

## 2022-09-20 DIAGNOSIS — Z1211 Encounter for screening for malignant neoplasm of colon: Secondary | ICD-10-CM

## 2022-09-23 ENCOUNTER — Other Ambulatory Visit: Payer: Self-pay | Admitting: Family Medicine

## 2022-09-23 DIAGNOSIS — Z1231 Encounter for screening mammogram for malignant neoplasm of breast: Secondary | ICD-10-CM

## 2022-11-18 ENCOUNTER — Ambulatory Visit: Payer: Commercial Managed Care - PPO

## 2023-01-10 ENCOUNTER — Ambulatory Visit
Admission: RE | Admit: 2023-01-10 | Discharge: 2023-01-10 | Disposition: A | Discharge status: Home / Self Care | Payer: Commercial Managed Care - PPO | Source: Ambulatory Visit | Attending: Family Medicine | Admitting: Family Medicine

## 2023-01-10 DIAGNOSIS — Z1231 Encounter for screening mammogram for malignant neoplasm of breast: Secondary | ICD-10-CM

## 2023-01-13 ENCOUNTER — Other Ambulatory Visit: Payer: Self-pay | Admitting: Family Medicine

## 2023-01-13 DIAGNOSIS — R928 Other abnormal and inconclusive findings on diagnostic imaging of breast: Secondary | ICD-10-CM

## 2023-01-25 ENCOUNTER — Ambulatory Visit
Admission: RE | Admit: 2023-01-25 | Discharge: 2023-01-25 | Disposition: A | Discharge status: Home / Self Care | Payer: Commercial Managed Care - PPO | Source: Ambulatory Visit | Attending: Family Medicine | Admitting: Family Medicine

## 2023-01-25 DIAGNOSIS — R928 Other abnormal and inconclusive findings on diagnostic imaging of breast: Secondary | ICD-10-CM

## 2023-12-30 ENCOUNTER — Other Ambulatory Visit: Payer: Self-pay | Admitting: Family Medicine

## 2023-12-30 DIAGNOSIS — Z1231 Encounter for screening mammogram for malignant neoplasm of breast: Secondary | ICD-10-CM

## 2024-01-12 ENCOUNTER — Ambulatory Visit
Admission: RE | Admit: 2024-01-12 | Discharge: 2024-01-12 | Disposition: A | Discharge status: Home / Self Care | Payer: Commercial Managed Care - PPO | Source: Ambulatory Visit | Attending: Family Medicine | Admitting: Family Medicine

## 2024-01-12 DIAGNOSIS — Z1231 Encounter for screening mammogram for malignant neoplasm of breast: Secondary | ICD-10-CM

## 2024-07-05 ENCOUNTER — Inpatient Hospital Stay: Attending: Oncology | Admitting: Licensed Clinical Social Worker

## 2024-07-05 ENCOUNTER — Inpatient Hospital Stay

## 2024-07-13 ENCOUNTER — Encounter: Payer: Self-pay | Admitting: *Deleted

## 2024-07-23 ENCOUNTER — Ambulatory Visit
Admission: RE | Admit: 2024-07-23 | Discharge: 2024-07-23 | Disposition: A | Discharge status: Home / Self Care | Attending: Gastroenterology | Admitting: Gastroenterology

## 2024-07-23 ENCOUNTER — Ambulatory Visit: Admitting: Anesthesiology

## 2024-07-23 ENCOUNTER — Encounter
Admission: RE | Disposition: A | Discharge status: Home / Self Care | Payer: Self-pay | Source: Home / Self Care | Attending: Gastroenterology

## 2024-07-23 DIAGNOSIS — Z8041 Family history of malignant neoplasm of ovary: Secondary | ICD-10-CM | POA: Insufficient documentation

## 2024-07-23 DIAGNOSIS — Z90710 Acquired absence of both cervix and uterus: Secondary | ICD-10-CM | POA: Diagnosis not present

## 2024-07-23 DIAGNOSIS — Z1211 Encounter for screening for malignant neoplasm of colon: Secondary | ICD-10-CM | POA: Insufficient documentation

## 2024-07-23 DIAGNOSIS — K2100 Gastro-esophageal reflux disease with esophagitis, without bleeding: Secondary | ICD-10-CM | POA: Insufficient documentation

## 2024-07-23 DIAGNOSIS — Z980 Intestinal bypass and anastomosis status: Secondary | ICD-10-CM | POA: Diagnosis not present

## 2024-07-23 DIAGNOSIS — I10 Essential (primary) hypertension: Secondary | ICD-10-CM | POA: Insufficient documentation

## 2024-07-23 DIAGNOSIS — Z800 Family history of malignant neoplasm of digestive organs: Secondary | ICD-10-CM | POA: Insufficient documentation

## 2024-07-23 DIAGNOSIS — Z87891 Personal history of nicotine dependence: Secondary | ICD-10-CM | POA: Insufficient documentation

## 2024-07-23 DIAGNOSIS — K449 Diaphragmatic hernia without obstruction or gangrene: Secondary | ICD-10-CM | POA: Insufficient documentation

## 2024-07-23 DIAGNOSIS — K640 First degree hemorrhoids: Secondary | ICD-10-CM | POA: Insufficient documentation

## 2024-07-23 DIAGNOSIS — Z9049 Acquired absence of other specified parts of digestive tract: Secondary | ICD-10-CM | POA: Insufficient documentation

## 2024-07-23 HISTORY — PX: COLONOSCOPY: SHX5424

## 2024-07-23 HISTORY — PX: ESOPHAGOGASTRODUODENOSCOPY: SHX5428

## 2024-07-23 SURGERY — COLONOSCOPY
Anesthesia: General

## 2024-07-23 MED ORDER — PROPOFOL 500 MG/50ML IV EMUL
INTRAVENOUS | Status: DC | PRN
  Administered 2024-07-23: 150 ug/kg/min via INTRAVENOUS

## 2024-07-23 MED ORDER — STERILE WATER FOR IRRIGATION IR SOLN
Status: DC | PRN
  Administered 2024-07-23: 60 mL

## 2024-07-23 MED ORDER — LIDOCAINE HCL (PF) 2 % IJ SOLN
INTRAMUSCULAR | Status: AC
  Filled 2024-07-23: qty 5

## 2024-07-23 MED ORDER — EPHEDRINE SULFATE-NACL 50-0.9 MG/10ML-% IV SOSY
PREFILLED_SYRINGE | INTRAVENOUS | Status: DC | PRN
  Administered 2024-07-23: 10 mg via INTRAVENOUS

## 2024-07-23 MED ORDER — PROPOFOL 10 MG/ML IV BOLUS
INTRAVENOUS | Status: DC | PRN
  Administered 2024-07-23: 100 mg via INTRAVENOUS

## 2024-07-23 MED ORDER — LIDOCAINE HCL (CARDIAC) PF 100 MG/5ML IV SOSY
PREFILLED_SYRINGE | INTRAVENOUS | Status: DC | PRN
  Administered 2024-07-23: 80 mg via INTRAVENOUS

## 2024-07-23 MED ORDER — PROPOFOL 1000 MG/100ML IV EMUL
INTRAVENOUS | Status: AC
  Filled 2024-07-23: qty 100

## 2024-07-23 MED ORDER — SODIUM CHLORIDE 0.9 % IV SOLN
INTRAVENOUS | Status: DC
  Administered 2024-07-23: 20 mL/h via INTRAVENOUS

## 2024-07-23 MED ORDER — EPHEDRINE 5 MG/ML INJ
INTRAVENOUS | Status: AC
  Filled 2024-07-23: qty 5

## 2024-07-23 NOTE — H&P (Signed)
 Outpatient short stay form Pre-procedure 07/23/2024  Diana ONEIDA Schick, MD  Primary Physician: Valora Agent, MD  Reason for visit:  Surveillance  History of present illness:    68 y/o lady with history of hypertension and large colon polyp that required surgery here for surveillance colonoscopy and EGD for GERD. No blood thinners. 2nd degree relatives with ovarian cancer and colon cancer. History of hysterectomy and cholecystectomy. Last colonoscopy in 2018.    Current Facility-Administered Medications:    0.9 %  sodium chloride  infusion, , Intravenous, Continuous, Glyn Gerads, Diana ONEIDA, MD, Last Rate: 20 mL/hr at 07/23/24 0714, 20 mL/hr at 07/23/24 0714  Facility-Administered Medications Ordered in Other Encounters:    sodium chloride  flush (NS) 0.9 % injection 3 mL, 3 mL, Intravenous, Q12H, Fath, Vinie LABOR, MD  Medications Prior to Admission  Medication Sig Dispense Refill Last Dose/Taking   olmesartan-hydrochlorothiazide (BENICAR HCT) 20-12.5 MG tablet Take 1 tablet by mouth daily.   07/23/2024 at  6:00 AM   Semaglutide,0.25 or 0.5MG /DOS, (OZEMPIC, 0.25 OR 0.5 MG/DOSE,) 2 MG/1.5ML SOPN Inject into the skin.   Past Month   amLODipine (NORVASC) 2.5 MG tablet Take 2.5 mg by mouth daily. (Patient not taking: Reported on 07/16/2024)   Not Taking   cholestyramine (QUESTRAN) 4 g packet Take 1 packet by mouth daily. Mix with juice or water         Allergies  Allergen Reactions   Erythromycin Anaphylaxis   Advil [Ibuprofen] Swelling   Aspirin  Other (See Comments)    Pt reports her father was allergic to aspirin  and concerned she may be allergic also   Cephalexin Hives   Doxycycline Hives   Penicillins Hives    Has patient had a PCN reaction causing immediate rash, facial/tongue/throat swelling, SOB or lightheadedness with hypotension:unsure Has patient had a PCN reaction causing severe rash involving mucus membranes or skin necrosis:Yes Has patient had a PCN reaction that required  hospitalization:No Has patient had a PCN reaction occurring within the last 10 years:No If all of the above answers are NO, then may proceed with Cephalosporin use.    Septra [Sulfamethoxazole-Trimethoprim] Hives   Vicodin [Hydrocodone-Acetaminophen ] Hives     Past Medical History:  Diagnosis Date   Anemia    BRCA negative 02/17/2017   variant of uncertain significant   Colon polyps    Complication of anesthesia    PT WAS TOLD SHE HAS A SMALL AIRWAY-PT GETS SEVER SHAKES AFTER GENERAL ANESTHESIA   Osteoporosis     Review of systems:  Otherwise negative.    Physical Exam  Gen: Alert, oriented. Appears stated age.  HEENT: PERRLA. Lungs: No respiratory distress CV: RRR Abd: soft, benign, no masses Ext: No edema    Planned procedures: Proceed with EGD/colonoscopy. The patient understands the nature of the planned procedure, indications, risks, alternatives and potential complications including but not limited to bleeding, infection, perforation, damage to internal organs and possible oversedation/side effects from anesthesia. The patient agrees and gives consent to proceed.  Please refer to procedure notes for findings, recommendations and patient disposition/instructions.     Diana ONEIDA Schick, MD [facility]

## 2024-07-23 NOTE — Interval H&P Note (Signed)
 History and Physical Interval Note:  07/23/2024 7:53 AM  Diana Rodriguez  has presented today for surgery, with the diagnosis of HISTORY OF ADENOMATOUS POLYP OF COLON,GERD.  The various methods of treatment have been discussed with the patient and family. After consideration of risks, benefits and other options for treatment, the patient has consented to  Procedure(s): COLONOSCOPY (N/A) EGD (ESOPHAGOGASTRODUODENOSCOPY) (N/A) as a surgical intervention.  The patient's history has been reviewed, patient examined, no change in status, stable for surgery.  I have reviewed the patient's chart and labs.  Questions were answered to the patient's satisfaction.     Diana Rodriguez  Ok to proceed with EGD/Colonoscopy

## 2024-07-23 NOTE — Anesthesia Preprocedure Evaluation (Signed)
 Anesthesia Evaluation  Patient identified by MRN, date of birth, ID band Patient awake    Reviewed: Allergy & Precautions, NPO status , Patient's Chart, lab work & pertinent test results  Airway Mallampati: III  TM Distance: >3 FB Neck ROM: full    Dental  (+) Teeth Intact   Pulmonary neg pulmonary ROS, Patient abstained from smoking., former smoker   Pulmonary exam normal breath sounds clear to auscultation       Cardiovascular Exercise Tolerance: Good hypertension, Pt. on medications negative cardio ROS Normal cardiovascular exam Rhythm:Regular Rate:Normal     Neuro/Psych negative neurological ROS  negative psych ROS   GI/Hepatic negative GI ROS, Neg liver ROS,,,  Endo/Other  negative endocrine ROS  Class 3 obesity  Renal/GU negative Renal ROS  negative genitourinary   Musculoskeletal   Abdominal  (+) + obese  Peds negative pediatric ROS (+)  Hematology negative hematology ROS (+) Blood dyscrasia, anemia   Anesthesia Other Findings Past Medical History: No date: Anemia 02/17/2017: BRCA negative     Comment:  variant of uncertain significant No date: Colon polyps No date: Complication of anesthesia     Comment:  PT WAS TOLD SHE HAS A SMALL AIRWAY-PT GETS SEVER SHAKES               AFTER GENERAL ANESTHESIA No date: Osteoporosis  Past Surgical History: No date: ABDOMINAL HYSTERECTOMY No date: APPENDECTOMY No date: CHOLECYSTECTOMY 08/23/2016: COLONOSCOPY WITH PROPOFOL ; N/A     Comment:  Procedure: COLONOSCOPY WITH PROPOFOL ;  Surgeon: Gladis RAYMOND Mariner, MD;  Location: [facility] ENDOSCOPY;  Service:               Endoscopy;  Laterality: N/A; 10/26/2017: COLONOSCOPY WITH PROPOFOL ; N/A     Comment:  Procedure: COLONOSCOPY WITH PROPOFOL ;  Surgeon: Dellie Louanne MATSU, MD;  Location: [facility] ENDOSCOPY;  Service:               Endoscopy;  Laterality: N/A; No date: DIAGNOSTIC  LAPAROSCOPY 02/14/2017: ENDOSCOPIC RETROGRADE CHOLANGIOPANCREATOGRAPHY (ERCP) WITH  PROPOFOL ; N/A     Comment:  Procedure: ENDOSCOPIC RETROGRADE               CHOLANGIOPANCREATOGRAPHY (ERCP) WITH PROPOFOL ;  Surgeon:               Rogelia Copping, MD;  Location: [facility] ENDOSCOPY;  Service:               Endoscopy;  Laterality: N/A; No date: LAPAROSCOPIC OOPHERECTOMY 12/20/2016: LAPAROSCOPIC RIGHT COLECTOMY; Right     Comment:  Procedure: LAPAROSCOPIC RIGHT COLECTOMY;  Surgeon:               Louanne MATSU Dellie, MD;  Location: [facility] ORS;  Service:              General;  Laterality: Right; 05/10/2019: LEFT HEART CATH AND CORONARY ANGIOGRAPHY; Left     Comment:  Procedure: LEFT HEART CATH AND CORONARY ANGIOGRAPHY;                Surgeon: Bosie Vinie LABOR, MD;  Location: [facility] INVASIVE CV              LAB;  Service: Cardiovascular;  Laterality: Left; 12/20/2016: UMBILICAL HERNIA REPAIR     Comment:  Procedure: HERNIA REPAIR UMBILICAL ADULT;  Surgeon:  Seeplaputhur KANDICE Muse, MD;  Location: [facility] ORS;  Service:              General;;  BMI    Body Mass Index: 32.97 kg/m      Reproductive/Obstetrics negative OB ROS                              Anesthesia Physical Anesthesia Plan  ASA: 2  Anesthesia Plan: General   Post-op Pain Management:    Induction: Intravenous  PONV Risk Score and Plan: Propofol  infusion and TIVA  Airway Management Planned: Natural Airway and Nasal Cannula  Additional Equipment:   Intra-op Plan:   Post-operative Plan:   Informed Consent: I have reviewed the patients History and Physical, chart, labs and discussed the procedure including the risks, benefits and alternatives for the proposed anesthesia with the patient or authorized representative who has indicated his/her understanding and acceptance.     Dental Advisory Given  Plan Discussed with: CRNA  Anesthesia Plan Comments:         Anesthesia Quick  Evaluation

## 2024-07-23 NOTE — Anesthesia Postprocedure Evaluation (Signed)
 Anesthesia Post Note  Patient: Diana Rodriguez  Procedure(s) Performed: COLONOSCOPY EGD (ESOPHAGOGASTRODUODENOSCOPY)  Patient location during evaluation: PACU Anesthesia Type: General Level of consciousness: awake and awake and alert Pain management: satisfactory to patient Vital Signs Assessment: post-procedure vital signs reviewed and stable Respiratory status: spontaneous breathing Cardiovascular status: stable Anesthetic complications: no   There were no known notable events for this encounter.   Last Vitals:  Vitals:   07/23/24 0844 07/23/24 0851  BP: (!) 112/57 117/62  Pulse: (!) 135   Resp: 14 14  Temp:    SpO2: 96%     Last Pain:  Vitals:   07/23/24 0832  TempSrc:   PainSc: 0-No pain                 VAN STAVEREN,Saladin Petrelli

## 2024-07-23 NOTE — Transfer of Care (Signed)
 Immediate Anesthesia Transfer of Care Note  Patient: Diana Rodriguez  Procedure(s) Performed: COLONOSCOPY EGD (ESOPHAGOGASTRODUODENOSCOPY)  Patient Location: PACU  Anesthesia Type:General  Level of Consciousness: awake and sedated  Airway & Oxygen Therapy: Patient Spontanous Breathing and Patient connected to nasal cannula oxygen  Post-op Assessment: Report given to RN and Post -op Vital signs reviewed and stable  Post vital signs: Reviewed and stable  Last Vitals:  Vitals Value Taken Time  BP    Temp    Pulse    Resp    SpO2      Last Pain:  Vitals:   07/23/24 0700  TempSrc: Temporal  PainSc: 0-No pain         Complications: There were no known notable events for this encounter.

## 2024-07-23 NOTE — Op Note (Signed)
 [facility]astroenterology Patient Name: Diana Rodriguez Procedure Date: 07/23/2024 7:20 AM MRN: [id number] Account #: [id number] Date of Birth: [birth year] Admit Type: Outpatient Age: 68 Room: [facility] ENDO ROOM 3 Gender: Female Note Status: Finalized Instrument Name: Barnie GI Scope [id number] Procedure:             Upper GI endoscopy Indications:           Gastro-esophageal reflux disease Providers:             Ole Schick MD, MD Referring MD:          Lynwood FALCON. Valora, MD (Referring MD) Medicines:             Monitored Anesthesia Care Complications:         No immediate complications. Procedure:             Pre-Anesthesia Assessment:                        - Prior to the procedure, a History and Physical was                         performed, and patient medications and allergies were                         reviewed. The patient is competent. The risks and                         benefits of the procedure and the sedation options and                         risks were discussed with the patient. All questions                         were answered and informed consent was obtained.                         Patient identification and proposed procedure were                         verified by the physician, the nurse, the                         anesthesiologist, the anesthetist and the technician                         in the endoscopy suite. Mental Status Examination:                         alert and oriented. Airway Examination: normal                         oropharyngeal airway and neck mobility. Respiratory                         Examination: clear to auscultation. CV Examination:                         normal. Prophylactic Antibiotics: The patient does not  require prophylactic antibiotics. Prior                         Anticoagulants: The patient has taken no anticoagulant                         or antiplatelet agents. ASA Grade  Assessment: II - A                         patient with mild systemic disease. After reviewing                         the risks and benefits, the patient was deemed in                         satisfactory condition to undergo the procedure. The                         anesthesia plan was to use monitored anesthesia care                         (MAC). Immediately prior to administration of                         medications, the patient was re-assessed for adequacy                         to receive sedatives. The heart rate, respiratory                         rate, oxygen saturations, blood pressure, adequacy of                         pulmonary ventilation, and response to care were                         monitored throughout the procedure. The physical                         status of the patient was re-assessed after the                         procedure.                        After obtaining informed consent, the endoscope was                         passed under direct vision. Throughout the procedure,                         the patient's blood pressure, pulse, and oxygen                         saturations were monitored continuously. The Endoscope                         was introduced through the mouth, and advanced to the  second part of duodenum. The upper GI endoscopy was                         accomplished without difficulty. The patient tolerated                         the procedure well. Findings:      A small hiatal hernia was present.      The exam of the esophagus was otherwise normal.      The entire examined stomach was normal.      The examined duodenum was normal. Impression:            - Small hiatal hernia.                        - Normal stomach.                        - Normal examined duodenum.                        - No specimens collected. Recommendation:        - Discharge patient to home.                        - Resume  previous diet.                        - Continue present medications.                        - Return to referring physician as previously                         scheduled. Procedure Code(s):     --- Professional ---                        [phone], Esophagogastroduodenoscopy, flexible,                         transoral; diagnostic, including collection of                         specimen(s) by brushing or washing, when performed                         (separate procedure) Diagnosis Code(s):     --- Professional ---                        K44.9, Diaphragmatic hernia without obstruction or                         gangrene                        K21.9, Gastro-esophageal reflux disease without                         esophagitis CPT copyright 2022 American Medical Association. All rights reserved. The codes documented in this report are preliminary and upon coder review may  be revised to meet current compliance requirements. Ole Schick  MD, MD 07/23/2024 8:20:49 AM Number of Addenda: 0 Note Initiated On: 07/23/2024 7:20 AM Estimated Blood Loss:  Estimated blood loss: none.      [facility]

## 2024-07-23 NOTE — Op Note (Addendum)
 [facility]astroenterology Patient Name: Diana Rodriguez Procedure Date: 07/23/2024 7:20 AM MRN: [id number] Account #: [id number] Date of Birth: May 13, 1956 Admit Type: Outpatient Age: 68 Room: [facility] ENDO ROOM 3 Gender: Female Note Status: Finalized Instrument Name: Colon Scope [id number] Procedure:             Colonoscopy Indications:           High risk colon cancer surveillance: Personal history                         of colonic polyps Providers:             Ole Schick MD, MD Referring MD:          Lynwood FALCON. Valora, MD (Referring MD) Medicines:             Monitored Anesthesia Care Complications:         No immediate complications. Procedure:             Pre-Anesthesia Assessment:                        - Prior to the procedure, a History and Physical was                         performed, and patient medications and allergies were                         reviewed. The patient is competent. The risks and                         benefits of the procedure and the sedation options and                         risks were discussed with the patient. All questions                         were answered and informed consent was obtained.                         Patient identification and proposed procedure were                         verified by the physician, the nurse, the                         anesthesiologist, the anesthetist and the technician                         in the endoscopy suite. Mental Status Examination:                         alert and oriented. Airway Examination: normal                         oropharyngeal airway and neck mobility. Respiratory                         Examination: clear to auscultation. CV Examination:  normal. Prophylactic Antibiotics: The patient does not                         require prophylactic antibiotics. Prior                         Anticoagulants: The patient has taken no anticoagulant                          or antiplatelet agents. ASA Grade Assessment: II - A                         patient with mild systemic disease. After reviewing                         the risks and benefits, the patient was deemed in                         satisfactory condition to undergo the procedure. The                         anesthesia plan was to use monitored anesthesia care                         (MAC). Immediately prior to administration of                         medications, the patient was re-assessed for adequacy                         to receive sedatives. The heart rate, respiratory                         rate, oxygen saturations, blood pressure, adequacy of                         pulmonary ventilation, and response to care were                         monitored throughout the procedure. The physical                         status of the patient was re-assessed after the                         procedure.                        After obtaining informed consent, the colonoscope was                         passed under direct vision. Throughout the procedure,                         the patient's blood pressure, pulse, and oxygen                         saturations were monitored continuously. The  Colonoscope was introduced through the anus and                         advanced to the the ileocolonic anastomosis. The                         colonoscopy was performed without difficulty. The                         patient tolerated the procedure well. The quality of                         the bowel preparation was good. The rectum was                         photographed. Findings:      The perianal and digital rectal examinations were normal.      There was evidence of a prior end-to-side ileo-colonic anastomosis in       the ascending colon. This was patent and was characterized by healthy       appearing mucosa.      Internal hemorrhoids were found during  retroflexion. The hemorrhoids       were Grade I (internal hemorrhoids that do not prolapse).      The exam was otherwise without abnormality on direct and retroflexion       views. Impression:            - Patent end-to-side ileo-colonic anastomosis,                         characterized by healthy appearing mucosa.                        - Internal hemorrhoids.                        - The examination was otherwise normal on direct and                         retroflexion views.                        - No specimens collected. Recommendation:        - Discharge patient to home.                        - Resume previous diet.                        - Continue present medications.                        - Repeat colonoscopy in 5 years for surveillance.                        - Return to referring physician as previously                         scheduled. Procedure Code(s):     --- Professional ---  G0105, Colorectal cancer screening; colonoscopy on                         individual at high risk Diagnosis Code(s):     --- Professional ---                        Z86.010, Personal history of colonic polyps                        Z98.0, Intestinal bypass and anastomosis status                        K64.0, First degree hemorrhoids CPT copyright 2022 American Medical Association. All rights reserved. The codes documented in this report are preliminary and upon coder review may  be revised to meet current compliance requirements. Ole Schick MD, MD 07/23/2024 8:24:23 AM Number of Addenda: 0 Note Initiated On: 07/23/2024 7:20 AM Scope Withdrawal Time: 0 hours 6 minutes 23 seconds  Total Procedure Duration: 0 hours 10 minutes 5 seconds  Estimated Blood Loss:  Estimated blood loss: none.      [facility]

## 2024-08-29 ENCOUNTER — Ambulatory Visit: Admitting: Internal Medicine

## 2024-09-21 ENCOUNTER — Ambulatory Visit: Admitting: Internal Medicine

## 2024-10-11 ENCOUNTER — Encounter: Payer: Self-pay | Admitting: Internal Medicine

## 2024-10-11 ENCOUNTER — Ambulatory Visit: Attending: Internal Medicine | Admitting: Internal Medicine

## 2024-10-11 VITALS — BP 128/62 | HR 61 | Ht 60.0 in | Wt 172.0 lb

## 2024-10-11 DIAGNOSIS — R072 Precordial pain: Secondary | ICD-10-CM

## 2024-10-11 DIAGNOSIS — R0602 Shortness of breath: Secondary | ICD-10-CM

## 2024-10-11 DIAGNOSIS — R079 Chest pain, unspecified: Secondary | ICD-10-CM

## 2024-10-11 MED ORDER — METOPROLOL TARTRATE 25 MG PO TABS: ORAL_TABLET | ORAL | 0 refills | Status: AC

## 2024-10-11 NOTE — Progress Notes (Unsigned)
  Cardiology Office Note:  .   Date:  10/11/2024  ID:  Diana Rodriguez, DOB 1956-01-28, MRN [id number] PCP: Valora Lynwood FALCON, MD  [facility] Providers Cardiologist:  None { Click to update primary MD,subspecialty MD or APP then REFRESH:1}    History of Present Illness: .   Diana Rodriguez is a 68 y.o. female with history of hypertension, hyperlipidemia, and borderline diabetes mellitus, referred for evaluation of chest pain.  She was seen by Dr. Bosie of [facility] clinic cardiology in 2020.  She initially had an abnormal stress test though subsequent catheterization revealed normal coronary arteries.  Occ CP when saw PCP a few months ago.  Had EGD and colonoscopy recently; found to have hiatal hernia.  4 siblings and parents both have heart problems.  Pain substernal.  Hasn't had any lately.  If bends over gets a little dizzy.  DOE with mild exertion.  Thinks she couldn't do stress test.  This summer, it was happening often but hasn't had as much last month or two.  Mostly felt pain after eating.  Had [place] pressure washing at the beach and felt like she was having MI.  Was dizzy and nauseated.  Told she may have been dehydrated by PCP.  Similar sx to 2020.  Had been dealing with since then but worse this summer.  [facility] and told everything looked okay.  Has been having some double vision and right arm aches for the last month.  No palpitations.  No edema.  Random LH.  No syncope or falls.  On Ozempic little over a year.  Had gestational DM and borderline DM -> Ozempic  Father had ASA reaction.  Never had reaction herself.  Doesn't exercise regularly.  ROS: See HPI  Studies Reviewed: SABRA   EKG Interpretation Date/Time:  Thursday October 11 2024 08:44:50 EST Ventricular Rate:  61 PR Interval:  152 QRS Duration:  80 QT Interval:  420 QTC Calculation: 422 R Axis:   13  Text Interpretation: Normal sinus rhythm Low voltage QRS Borderline ECG When compared  with ECG of 13-Feb-2017 10:48, Low voltage QRS is now Present Confirmed by Maykel Reitter, Lonni [phone]) on 10/11/2024 8:48:48 AM    LHC (05/10/2019): Normal coronary arteries and LV systolic function.  Risk Assessment/Calculations:   {Does this patient have ATRIAL FIBRILLATION?:[phone]}         Physical Exam:   VS:  BP 128/62 (BP Location: Right Arm, Patient Position: Sitting, Cuff Size: Normal)   Pulse 61   Ht 5' (1.524 m)   Wt 172 lb (78 kg)   SpO2 100%   BMI 33.59 kg/m    Wt Readings from Last 3 Encounters:  10/11/24 172 lb (78 kg)  07/23/24 168 lb 12.8 oz (76.6 kg)  05/10/19 180 lb (81.6 kg)    General:  NAD. Neck: No JVD or HJR. Lungs: Clear to auscultation bilaterally without wheezes or crackles. Heart: Regular rate and rhythm without murmurs, rubs, or gallops. Abdomen: Soft, nontender, nondistended. Extremities: No lower extremity edema.  ASSESSMENT AND PLAN: .    ***    {Are you ordering a CV Procedure (e.g. stress test, cath, DCCV, TEE, etc)?   Press F2        :[phone]}  Dispo: ***  Signed, Lonni Hanson, MD

## 2024-10-11 NOTE — Patient Instructions (Signed)
 Medication Instructions:  Your physician recommends the following medication changes.  START TAKING: Omeprazole 20 mg by mouth daily (May purchase over the counter)    *If you need a refill on your cardiac medications before your next appointment, please call your pharmacy*  Lab Work: Your provider would like for you to have following labs drawn today BMP.     Testing/Procedures: Your physician has requested that you have an echocardiogram. Echocardiography is a painless test that uses sound waves to create images of your heart. It provides your doctor with information about the size and shape of your heart and how well your heart's chambers and valves are working.   You may receive an ultrasound enhancing agent through an IV if needed to better visualize your heart during the echo. This procedure takes approximately one hour.  There are no restrictions for this procedure.  This will take place at [address] [facility]) [address], [place] [address]  Please note: We ask at that you not bring children with you during ultrasound (echo/ vascular) testing. Due to room size and safety concerns, children are not allowed in the ultrasound rooms during exams. Our front office staff cannot provide observation of children in our lobby area while testing is being conducted. An adult accompanying a patient to their appointment will only be allowed in the ultrasound room at the discretion of the ultrasound technician under special circumstances. We apologize for any inconvenience.     Your cardiac CT will be scheduled at one of the below locations:   [facility] [address] [place], [place] [address] [phone]  If scheduled at [facility] or [facility], please arrive 15 mins early for check-in and test prep.  There is spacious parking and easy access to the radiology department from the [facility] and  Vascular entrance. Please enter here and check-in with the desk attendant.   Please follow these instructions carefully (unless otherwise directed):  An IV will be required for this test and Nitroglycerin will be given.    On the Night Before the Test: Be sure to Drink plenty of water . Do not consume any caffeinated/decaffeinated beverages or chocolate 12 hours prior to your test. Do not take any antihistamines 12 hours prior to your test.  On the Day of the Test: Drink plenty of water  until 1 hour prior to the test. Do not eat any food 1 hour prior to test. You may take your regular medications prior to the test.  Take metoprolol (Lopressor) 25 mg two hours prior to test. If you take Furosemide/Hydrochlorothiazide/Spironolactone/Chlorthalidone, please HOLD on the morning of the test. Patients who wear a continuous glucose monitor MUST remove the device prior to scanning. FEMALES- please wear underwire-free bra if available, avoid dresses & tight clothing       After the Test: Drink plenty of water . After receiving IV contrast, you may experience a mild flushed feeling. This is normal. On occasion, you may experience a mild rash up to 24 hours after the test. This is not dangerous. If this occurs, you can take Benadryl  25 mg, Zyrtec, Claritin, or Allegra and increase your fluid intake. (Patients taking Tikosyn should avoid Benadryl , and may take Zyrtec, Claritin, or Allegra) If you experience trouble breathing, this can be serious. If it is severe call 911 IMMEDIATELY. If it is mild, please call our office.  We will call to schedule your test 2-4 weeks out understanding that some insurance  companies will need an authorization prior to the service being performed.   For more information and frequently asked questions, please visit our website : [email or web address]  For non-scheduling related questions, please contact the cardiac imaging nurse navigator should you have any  questions/concerns: Cardiac Imaging Nurse Navigators Direct Office Dial: [phone]   For scheduling needs, including cancellations and rescheduling, please call Brittany, [phone].    Follow-Up: At [facility], you and your health needs are our priority.  As part of our continuing mission to provide you with exceptional heart care, our providers are all part of one team.  This team includes your primary Cardiologist (physician) and Advanced Practice Providers or APPs (Physician Assistants and Nurse Practitioners) who all work together to provide you with the care you need, when you need it.  Your next appointment:   6 week(s)  Provider:   You may see Lonni Hanson, MD or one of the following Advanced Practice Providers on your designated Care Team:   Lonni Meager, NP Lesley Maffucci, PA-C Bernardino Bring, PA-C Cadence [place], PA-C Tylene Lunch, NP Barnie Hila, NP

## 2024-10-12 ENCOUNTER — Encounter: Payer: Self-pay | Admitting: Internal Medicine

## 2024-10-12 ENCOUNTER — Ambulatory Visit: Payer: Self-pay | Admitting: Internal Medicine

## 2024-10-12 LAB — BASIC METABOLIC PANEL WITH GFR
BUN/Creatinine Ratio: 15 (ref 12–28)
BUN: 12 mg/dL (ref 8–27)
CO2: 23 mmol/L (ref 20–29)
Calcium: 9.3 mg/dL (ref 8.7–10.3)
Chloride: 102 mmol/L (ref 96–106)
Creatinine, Ser: 0.78 mg/dL (ref 0.57–1.00)
Glucose: 100 mg/dL — ABNORMAL HIGH (ref 70–99)
Potassium: 4.6 mmol/L (ref 3.5–5.2)
Sodium: 139 mmol/L (ref 134–144)
eGFR: 83 mL/min/1.73 (ref >59)

## 2024-10-30 ENCOUNTER — Encounter (HOSPITAL_COMMUNITY): Payer: Self-pay

## 2024-10-31 ENCOUNTER — Telehealth (HOSPITAL_COMMUNITY): Payer: Self-pay | Admitting: Emergency Medicine

## 2024-10-31 ENCOUNTER — Ambulatory Visit: Attending: Internal Medicine

## 2024-10-31 DIAGNOSIS — R0602 Shortness of breath: Secondary | ICD-10-CM | POA: Diagnosis not present

## 2024-10-31 DIAGNOSIS — R072 Precordial pain: Secondary | ICD-10-CM

## 2024-10-31 LAB — ECHOCARDIOGRAM COMPLETE
AR max vel: 2.25 cm2
AV Area VTI: 2.22 cm2
AV Area mean vel: 2.22 cm2
AV Mean grad: 4 mmHg
AV Peak grad: 6.9 mmHg
Ao pk vel: 1.31 m/s
Area-P 1/2: 3.85 cm2
S' Lateral: 2.32 cm

## 2024-10-31 NOTE — Telephone Encounter (Signed)
 Pt patient states she will r/s with [facility]. Explained that they dont do coronary CTAs  Camie Shutter RN Navigator Cardiac Imaging [facility] V[facility] Services [phone] Office  [phone] Cell

## 2024-11-01 ENCOUNTER — Telehealth: Payer: Self-pay | Admitting: Internal Medicine

## 2024-11-01 ENCOUNTER — Other Ambulatory Visit: Payer: Self-pay | Admitting: Emergency Medicine

## 2024-11-01 ENCOUNTER — Telehealth: Payer: Self-pay | Admitting: Emergency Medicine

## 2024-11-01 ENCOUNTER — Ambulatory Visit: Admission: RE | Admit: 2024-11-01 | Source: Ambulatory Visit

## 2024-11-01 DIAGNOSIS — R072 Precordial pain: Secondary | ICD-10-CM

## 2024-11-01 NOTE — Telephone Encounter (Signed)
 Error

## 2024-11-01 NOTE — Telephone Encounter (Signed)
 Pt requesting a order be faxed over to [facility] for a Cardiac CT.

## 2024-11-01 NOTE — Telephone Encounter (Signed)
 Called and spoke with the patient to get the name of the facility that the patient wanted the order for CT faxed to.  Patient provided the fax number.  Order faxed to [facility] located at [address] with fax number [phone].

## 2024-12-14 ENCOUNTER — Ambulatory Visit: Admitting: Internal Medicine
